# Patient Record
Sex: Male | Born: 1965
Health system: Southern US, Community
[De-identification: ages and names within clinical notes are randomized; demographics above are authoritative.]

## PROBLEM LIST (undated history)

## (undated) DIAGNOSIS — E119 Type 2 diabetes mellitus without complications: Secondary | ICD-10-CM

## (undated) DIAGNOSIS — K7689 Other specified diseases of liver: Secondary | ICD-10-CM

## (undated) DIAGNOSIS — K219 Gastro-esophageal reflux disease without esophagitis: Secondary | ICD-10-CM

## (undated) DIAGNOSIS — T7840XA Allergy, unspecified, initial encounter: Secondary | ICD-10-CM

## (undated) DIAGNOSIS — F419 Anxiety disorder, unspecified: Secondary | ICD-10-CM

## (undated) DIAGNOSIS — M1990 Unspecified osteoarthritis, unspecified site: Secondary | ICD-10-CM

## (undated) DIAGNOSIS — R079 Chest pain, unspecified: Secondary | ICD-10-CM

## (undated) DIAGNOSIS — E669 Obesity, unspecified: Secondary | ICD-10-CM

## (undated) DIAGNOSIS — I10 Essential (primary) hypertension: Secondary | ICD-10-CM

## (undated) DIAGNOSIS — R7989 Other specified abnormal findings of blood chemistry: Secondary | ICD-10-CM

## (undated) HISTORY — PX: COLONOSCOPY: SHX174

## (undated) HISTORY — PX: HERNIA REPAIR: SHX51

## (undated) HISTORY — DX: Unspecified osteoarthritis, unspecified site: M19.90

## (undated) HISTORY — DX: Chest pain, unspecified: R07.9

## (undated) HISTORY — DX: Obesity, unspecified: E66.9

## (undated) HISTORY — DX: Allergy, unspecified, initial encounter: T78.40XA

## (undated) HISTORY — PX: CHOLECYSTECTOMY: SHX55

## (undated) HISTORY — DX: Other specified diseases of liver: K76.89

## (undated) HISTORY — DX: Type 2 diabetes mellitus without complications: E11.9

## (undated) HISTORY — PX: BACK SURGERY: SHX140

## (undated) HISTORY — DX: Other specified abnormal findings of blood chemistry: R79.89

## (undated) HISTORY — DX: Essential (primary) hypertension: I10

---

## 2010-10-17 ENCOUNTER — Inpatient Hospital Stay (HOSPITAL_COMMUNITY)
Admission: EM | Admit: 2010-10-17 | Discharge: 2010-10-18 | Disposition: A | Discharge status: Home / Self Care | Payer: BC Managed Care – PPO | Attending: Cardiovascular Disease | Admitting: Cardiovascular Disease

## 2010-10-18 LAB — BASIC METABOLIC PANEL
Calcium: 9.4 mg/dL (ref 8.4–10.5)
GFR calc Af Amer: >60 mL/min (ref >60)
GFR calc non Af Amer: >60 mL/min (ref >60)
Glucose, Bld: 93 mg/dL (ref 70–99)
Potassium: 4.2 mEq/L (ref 3.5–5.1)
Sodium: 140 mEq/L (ref 135–145)

## 2010-10-18 LAB — PROTIME-INR
INR: 1.02 (ref 0.00–1.49)
Prothrombin Time: 13.6 seconds (ref 11.6–15.2)

## 2010-10-18 LAB — CBC
HCT: 42.1 % (ref 39.0–52.0)
MCHC: 34.9 g/dL (ref 30.0–36.0)
Platelets: 147 10*3/uL — ABNORMAL LOW (ref 150–400)
RDW: 13.6 % (ref 11.5–15.5)
WBC: 6.2 10*3/uL (ref 4.0–10.5)

## 2010-10-24 ENCOUNTER — Telehealth: Payer: Self-pay | Admitting: Internal Medicine

## 2010-11-03 NOTE — Progress Notes (Signed)
Summary: pt not feeling well since he started protonix/2nd call  Phone Note Call from Patient Call back at [phone]   Caller: Patient Reason for Call: Talk to Nurse, Talk to Doctor Summary of Call: pt was given a Rx for protonix and his whole body is sore and hurts and he is very tired is that a side effect of medication. Initial call taken by: Omer Jack,  October 24, 2010 12:13 PM  Follow-up for Phone Call        pt states to please call him after 3pm. Follow-up by: Roe Coombs,  October 24, 2010 2:34 PM  Additional Follow-up for Phone Call Additional follow up Details #1::        spoke w/pt he states that he has been feeling bad, c/o increased fatigue not sure what its from advised cath ok, to f/u w/pcp have thyroid checked he is agreeable Meredith Staggers, RN  October 24, 2010 3:39 PM

## 2010-11-08 ENCOUNTER — Ambulatory Visit: Payer: Self-pay | Admitting: Gastroenterology

## 2010-11-08 DIAGNOSIS — R69 Illness, unspecified: Secondary | ICD-10-CM

## 2010-11-09 NOTE — Procedures (Signed)
  NAME:  GARDINER, ESPANA               ACCOUNT NO.:  [id number]  MEDICAL RECORD NO.:  [id number]          PATIENT TYPE:  INP  LOCATION:  6522                         FACILITY:  [facility]  PHYSICIAN:  Bevelyn Buckles. Tajuana Kniskern, MDDATE OF BIRTH:  10-15-1965  DATE OF PROCEDURE:  10/18/2010 DATE OF DISCHARGE:  10/18/2010                           CARDIAC CATHETERIZATION   PATIENT IDENTIFICATION:  Mr. Pabst is a 45 year old male with a history of obesity and hypertension who was admitted to [facility] with chest pain.  He ruled out for MI with serial cardiac markers.  EKG was nonacute.  He went for a Myoview, which had normal EKG, but question of perfusion defect, and was referred for cardiac catheterization.  PROCEDURES PERFORMED: 1. Selective coronary angiography. 2. Left heart catheterization. 3. Left ventriculogram.  DESCRIPTION OF PROCEDURE:  The risks and indications were explained. Consent was signed and placed on the chart.  After confirmation of a normal Allen's test, the right wrist area was prepped and draped in routine sterile fashion, anesthetized with 1% local lidocaine.  A 5- French arterial sheath was placed using a modified Seldinger technique. Standard catheters including a JR-4, JR-3.5 angled pigtail were used. All catheter exchanges were made over wire.  There were no apparent complications.  Central aortic pressure 113/80 with a mean of 95.  LV pressure 107/6 with an EDP of 11.  There is no aortic stenosis.  Left main was normal.  LAD coursed to the apex, gave off 2 diagonal branches.  It had perhaps mild tapering in the mid-to-distal vessel, but no significant stenoses.  Left circumflex gave off small OM-1, a large OM-2, and a posterolateral and was angiographically normal.  Right coronary artery was a large dominant vessel, gave off large PDA and posterolateral, and was angiographically normal.  Left ventriculogram done in the RAO position showed an EF of  55% to 60% with no regional wall motion abnormalities.  ASSESSMENT: 1. Normal coronary arteries. 2. Normal left ventricular function.  PLAN/DISCUSSION:  Based on his coronary anatomy, I do not suspect his chest pain is cardiac in nature.  I suspect that his stress test was false positive.  We will have him be discharged today as long as his access site is stable.  I would start Protonix 40 mg a day and he can follow up with GI as an outpatient as needed for persistent chest pain.     Bevelyn Buckles. Kalli Greenfield, MD     DRB/MEDQ  D:  10/18/2010  T:  10/19/2010  Job:  [id number]  Electronically Signed by Arvilla Meres MD on 11/09/2010 01:52:31 PM

## 2010-11-09 NOTE — Discharge Summary (Signed)
NAME:  Brad [name], Brad [name]               ACCOUNT NO.:  [id number]  MEDICAL RECORD NO.:  [id number]          PATIENT TYPE:  INP  LOCATION:  6522                         FACILITY:  [facility]  PHYSICIAN:  Bevelyn Buckles. Bensimhon, MDDATE OF BIRTH:  April 01, 1966  DATE OF ADMISSION:  10/17/2010 DATE OF DISCHARGE:  10/18/2010                              DISCHARGE SUMMARY   DISCHARGE DIAGNOSIS:  Noncardiac chest pain (most likely gastrointestinal in etiology). A.  Abnormal stress Myoview at [facility]. B.  Cardiac catheterization, October 18, 2010, revealing normal coronary arteries with normal left ventricular systolic function with left ventricular ejection fraction 55-60%.  Symptoms are most consistent with gastroesophageal reflux disease.  The patient was given prescription for Protonix 40 mg p.o. daily and instructed to follow up with primary care physician or gastroenterology if he prefers.  SECONDARY DIAGNOSIS:  Hypertension. A.  No changes made to preadmission medication regimen with minimal hypertension noted this admission.  ALLERGIES:  NKDA.  PROCEDURES:  Cardiac catheterization, October 18, 2010:  Please see discharge diagnoses section #1, subsection B.  HISTORY OF PRESENT ILLNESS:  Brad [name] is a 45 year old gentleman with no known history of coronary artery disease with risk factor of hypertension who presented to [facility] with complaints of chest discomfort on October 14, 2010.  He first noted this when he was at work sitting at his desk and it lasted all day.  The pain would wax and wane, but became worse for about an hour and then would subside.  It never entirely resolved.  He described there is a tightness in his left chest and was associated with left arm numbness and tingling.  Sometimes, it is hard for him to breathe (pleuritic).  When he came to [facility], he described associated diaphoresis and nausea when his pain was at its worst.  He was treated  with a nitroglycerin patch, which he reports significantly alleviating his symptoms.  The patient is certain that his symptoms are not worse with activity.  He does not associate his symptoms with p.o. intake.  He has had episodes of nausea and vomiting after eating, but this was not preceded by the same symptoms leading to his evaluation at [facility] this time.  The patient eventually underwent stress test at [facility] that was abnormal, and he was subsequently sent to [facility] for diagnostic cardiac catheterization.  HOSPITAL COURSE:  The patient was admitted in the early morning of October 18, 2010, and eventually underwent cardiac catheterization on that same date by cardiologist, Dr. Arvilla Meres.  He had normal coronary arteries with normal LV function, EF 55-60%.  It was determined by Dr. Arvilla Meres that his symptoms were most consistent with gastrointestinal etiology, and therefore he was given a prescription for Protonix 40 mg p.o. daily, and instructed to follow up with a primary care physician for Gastroenterology if he preferred.  At the time of discharge, the patient received his new medication list, prescription for Protonix, postcath instructions, followup instructions, and all questions and concerns were addressed prior to him leaving the hospital.  DISCHARGE LABORATORIES:  WBC is 6.2, HGB 14.7, HCT 42.1, PLT  count is 147.  Protime 13.6, INR 1.02.  Sodium 147, potassium 4.2, chloride 102, bicarb 28, BUN 11, creatinine 1.07, calcium 9.4, glucose 93.  FOLLOWUP PLANS AND APPOINTMENTS:  Please see hospital course.  DISCHARGE MEDICATIONS: 1. Benazepril 20 mg p.o. daily. 2. Norvasc 5 mg p.o. daily. 3. Protonix 40 mg p.o. daily.  DURATION OF DISCHARGE ENCOUNTER:  Including the physician time was 45 minutes.     Jarrett Ables, PAC   ______________________________ Bevelyn Buckles. Bensimhon, MD    MS/MEDQ  D:  10/18/2010  T:  10/19/2010  Job:   [id number]  Electronically Signed by Jarrett Ables PAC on 10/26/2010 03:01:26 PM Electronically Signed by Arvilla Meres MD on 11/09/2010 01:52:28 PM

## 2011-02-23 ENCOUNTER — Encounter: Payer: Self-pay | Admitting: *Deleted

## 2011-02-24 ENCOUNTER — Encounter: Payer: Self-pay | Admitting: Cardiology

## 2011-02-24 ENCOUNTER — Ambulatory Visit (INDEPENDENT_AMBULATORY_CARE_PROVIDER_SITE_OTHER): Payer: BC Managed Care – PPO | Admitting: Cardiology

## 2011-02-24 VITALS — BP 134/88 | HR 63 | Ht 75.0 in | Wt 280.0 lb

## 2011-02-24 DIAGNOSIS — I10 Essential (primary) hypertension: Secondary | ICD-10-CM | POA: Insufficient documentation

## 2011-02-24 DIAGNOSIS — R0789 Other chest pain: Secondary | ICD-10-CM

## 2011-02-24 DIAGNOSIS — K219 Gastro-esophageal reflux disease without esophagitis: Secondary | ICD-10-CM

## 2011-02-24 DIAGNOSIS — R9430 Abnormal result of cardiovascular function study, unspecified: Secondary | ICD-10-CM | POA: Insufficient documentation

## 2011-02-24 DIAGNOSIS — R079 Chest pain, unspecified: Secondary | ICD-10-CM | POA: Insufficient documentation

## 2011-02-24 MED ORDER — NITROGLYCERIN 0.4 MG SL SUBL: 0.4000 mg | SUBLINGUAL_TABLET | SUBLINGUAL | Status: DC | PRN

## 2011-02-24 NOTE — Assessment & Plan Note (Signed)
Given the patient prescription of sublingual nitroglycerin as in the past this has provided relief. I do not think the patient has coronary spasm however esophageal spasm which seemed to improve his symptoms. I told him that he needs to use nitroglycerin on a consistent basis to make an appointment with a gastroenterologist and further diagnostic evaluation may be indicated

## 2011-02-24 NOTE — Progress Notes (Signed)
HPI The patient is a 45 year old male with history of obesity and hypertension was admitted earlier this year to [facility] hospital with chest pain. He ruled out for MI with serial cardiac markers. There were no acute EKG changes. He underwent a Lexi scan which had a normal EKG but there was some question of a perfusion defect. He was then referred for cardiac catheterization. His catheterization was performed in January of 2012 he was found to have normal coronary arteries and normal LV function. It appeared that he had a false-positive stress test. Recommendations were to followup with GI and to start Protonix 40 mg by mouth daily. The patient requested nonappointment because of ongoing symptoms of chest pain. His symptoms of chest pain however very atypical. He has had some problems with reflux which could include Protonix. There is no exertional component to this. I suspect the patient has some esophageal spasm. There has been relieved aspirin sublingual nitroglycerin. His blood pressure diastolic blood pressure mildly elevated. The patient also complains of some swelling in the left lower extremity where he has quite a few varicose veins and also oker dermatitis secondary to pigmentation from his varicose veins. He doesn't really report any night cramps. The patient does have a standing job all day long. He denies any orthopnea PND palpitations or syncope and otherwise from a cardiac standpoint is doing quite well.  No Known Allergies  Current Outpatient Prescriptions on File Prior to Visit  Medication Sig Dispense Refill  . amLODipine-benazepril (LOTREL) 5-20 MG per capsule Take 1 capsule by mouth daily.        . pantoprazole (PROTONIX) 40 MG tablet Take 40 mg by mouth daily.          Past Medical History  Diagnosis Date  . Chest pain, unspecified   . Unspecified essential hypertension   . Obesity, unspecified   . Other chronic nonalcoholic liver disease   . Other abnormal blood chemistry       Past Surgical History  Procedure Date  . Cholecystectomy     Family History  Problem Relation Age of Onset  . Coronary artery disease      FATHER    History   Social History  . Marital Status: Married    Spouse Name: CYNTHIA    Number of Children: N/A  . Years of Education: N/A   Occupational History  . [employer]    Social History Main Topics  . Smoking status: Never Smoker   . Smokeless tobacco: Never Used  . Alcohol Use: Not on file  . Drug Use: Not on file  . Sexually Active: Not on file   Other Topics Concern  . Not on file   Social History Narrative  . No narrative on file   [id number] positives as outlined above. The remainder of the 18  point review of systems is negative   PHYSICAL EXAM BP 134/88  Pulse 63  Ht 6\' 3"  (1.905 m)  Wt 280 lb (127.007 kg)  BMI 35.00 kg/m2  General: Well-developed, well-nourished in no distress Head: Normocephalic and atraumatic Eyes:PERRLA/EOMI intact, conjunctiva and lids normal Ears: No deformity or lesions Mouth:normal dentition, normal posterior pharynx Neck: Supple, no JVD.  No masses, thyromegaly or abnormal cervical nodes Lungs: Normal breath sounds bilaterally without wheezing.  Normal percussion Cardiac: regular rate and rhythm with normal S1 and S2, no S3 or S4.  PMI is normal.  No pathological murmurs Abdomen: Normal bowel sounds, abdomen is soft and nontender without masses, organomegaly or hernias  noted.  No hepatosplenomegaly MSK: Back normal, normal gait muscle strength and tone normal Vascular: Pulse is normal in all 4 extremities Extremities: No peripheral pitting edema Neurologic: Alert and oriented x 3 Skin: Intact without lesions or rashes Lymphatics: No significant adenopathy Psychologic: Normal affect  ECG:  ASSESSMENT AND PLAN

## 2011-02-24 NOTE — Patient Instructions (Signed)
   Low pressure compression stockings  Elevate legs  Nitroglycerin as needed for severe chest pain only  Monitor blood pressure at home.  If diastolic (bottom number) is greater than 85, call the office - may need addition of possible diuretic.   Your physician wants you to follow up in:  1 year.  You will receive a reminder letter in the mail one-two months in advance.  If you don't receive a letter, please call our office to schedule the follow up appointment

## 2011-02-24 NOTE — Assessment & Plan Note (Signed)
No significant coronary artery disease: The patient still concerned given the prior false-positive nuclear study but his cardiac catheterization could be a false-negative study. I told him that this is extremely unlikely and that there is no evidence that he has any significant coronary artery disease.

## 2011-02-24 NOTE — Assessment & Plan Note (Signed)
Mild elevation in diastolic blood pressure. I've asked the patient to check his blood pressure at home. His diastolic blood pressures consistently greater than 85 mmHg advising to call her office and we will also likely add a small dose of a diuretic on chlorthalidone 12.5 mg a day particularly in light of the fact that he is taking to vasodilators.

## 2011-04-06 ENCOUNTER — Other Ambulatory Visit: Payer: Self-pay | Admitting: *Deleted

## 2011-04-06 MED ORDER — PANTOPRAZOLE SODIUM 40 MG PO TBEC: 40.0000 mg | DELAYED_RELEASE_TABLET | Freq: Every day | ORAL | Status: DC

## 2011-11-15 ENCOUNTER — Encounter (HOSPITAL_COMMUNITY): Payer: Self-pay | Admitting: Pharmacy Technician

## 2011-11-21 NOTE — Pre-Procedure Instructions (Signed)
[name]  11/21/2011   Your procedure is scheduled on:  MARCH 13  Report to [facility] at 0630 AM.  Call this number if you have problems the morning of surgery: [phone]   Remember:   Do not eat food:After Midnight.  May have clear liquids: up to 4 Hours before arrival.230AM  Clear liquids include soda, tea, black coffee, apple or grape juice, broth.  Take these medicines the morning of surgery with A SIP OF WATER: AMLODIPINE-BENAZEPRIL,HYDROCODONE   Do not wear jewelry, make-up or nail polish.  Do not wear lotions, powders, or perfumes. You may wear deodorant.  Do not shave 48 hours prior to surgery.  Do not bring valuables to the hospital.  Contacts, dentures or bridgework may not be worn into surgery.  Leave suitcase in the car. After surgery it may be brought to your room.  For patients admitted to the hospital, checkout time is 11:00 AM the day of discharge.   Patients discharged the day of surgery will not be allowed to drive home.  Name and phone number of your driver: FAMILY  Special Instructions: CHG Shower Use Special Wash: 1/2 bottle night before surgery and 1/2 bottle morning of surgery.   Please read over the following fact sheets that you were given: MRSA Information and Surgical Site Infection Prevention

## 2011-11-22 ENCOUNTER — Encounter (HOSPITAL_COMMUNITY)
Admission: RE | Admit: 2011-11-22 | Discharge: 2011-11-22 | Disposition: A | Discharge status: Home / Self Care | Payer: BC Managed Care – PPO | Source: Ambulatory Visit | Attending: Neurosurgery | Admitting: Neurosurgery

## 2011-11-22 ENCOUNTER — Other Ambulatory Visit: Payer: Self-pay

## 2011-11-22 ENCOUNTER — Encounter (HOSPITAL_COMMUNITY)
Admission: RE | Admit: 2011-11-22 | Discharge: 2011-11-22 | Disposition: A | Discharge status: Home / Self Care | Payer: BC Managed Care – PPO | Source: Ambulatory Visit | Attending: Anesthesiology | Admitting: Anesthesiology

## 2011-11-22 ENCOUNTER — Encounter (HOSPITAL_COMMUNITY): Payer: Self-pay

## 2011-11-22 LAB — CBC
Hemoglobin: 16.1 g/dL (ref 13.0–17.0)
MCH: 30.4 pg (ref 26.0–34.0)
Platelets: 137 10*3/uL — ABNORMAL LOW (ref 150–400)
RBC: 5.29 MIL/uL (ref 4.22–5.81)
WBC: 7.3 10*3/uL (ref 4.0–10.5)

## 2011-11-22 LAB — BASIC METABOLIC PANEL
Calcium: 10.3 mg/dL (ref 8.4–10.5)
GFR calc Af Amer: >90 mL/min (ref >90)
GFR calc non Af Amer: 83 mL/min — ABNORMAL LOW (ref >90)
Glucose, Bld: 97 mg/dL (ref 70–99)
Potassium: 5.3 mEq/L — ABNORMAL HIGH (ref 3.5–5.1)
Sodium: 140 mEq/L (ref 135–145)

## 2011-11-22 LAB — SURGICAL PCR SCREEN
MRSA, PCR: NEGATIVE
Staphylococcus aureus: NEGATIVE

## 2011-11-22 NOTE — Pre-Procedure Instructions (Signed)
[name]  11/22/2011   Your procedure is scheduled on:  11/29/11*  Report to [facility] at 630 AM.  Call this number if you have problems the morning of surgery: [phone]   Remember:   Do not eat food:After Midnight.  May have clear liquids: up to 4 Hours before arrival.  Clear liquids include soda, tea, black coffee, apple or grape juice, broth.  Take these medicines the morning of surgery with A SIP OF WATER AMLODIPINE,NORCO, ROBAXIN   Do not wear jewelry, make-up or nail polish.  Do not wear lotions, powders, or perfumes. You may wear deodorant.  Do not shave 48 hours prior to surgery.  Do not bring valuables to the hospital.  Contacts, dentures or bridgework may not be worn into surgery.  Leave suitcase in the car. After surgery it may be brought to your room.  For patients admitted to the hospital, checkout time is 11:00 AM the day of discharge.   Patients discharged the day of surgery will not be allowed to drive home.  Name and phone number of your driver:  Special Instructions: CHG Shower Use Special Wash: 1/2 bottle night before surgery and 1/2 bottle morning of surgery.   Please read over the following fact sheets that you were given: Pain Booklet, Coughing and Deep Breathing, Blood Transfusion Information, MRSA Information and Surgical Site Infection Prevention

## 2011-11-22 NOTE — Progress Notes (Signed)
NOTE IN EPIC FROM DR Berstein [facility]iker [facility] 02/24/11 WHICH INCLUDES STRESS AND CATH RESULTS

## 2011-11-28 MED ORDER — CEFAZOLIN SODIUM-DEXTROSE 2-3 GM-% IV SOLR
2.0000 g | INTRAVENOUS | Status: AC
  Administered 2011-11-29: 2 g via INTRAVENOUS
  Filled 2011-11-28: qty 50

## 2011-11-29 ENCOUNTER — Encounter (HOSPITAL_COMMUNITY): Payer: Self-pay | Admitting: Surgery

## 2011-11-29 ENCOUNTER — Ambulatory Visit (HOSPITAL_COMMUNITY)
Admission: RE | Admit: 2011-11-29 | Discharge: 2011-12-01 | Disposition: A | Discharge status: Home / Self Care | Payer: BC Managed Care – PPO | Source: Ambulatory Visit | Attending: Neurosurgery | Admitting: Neurosurgery

## 2011-11-29 ENCOUNTER — Encounter (HOSPITAL_COMMUNITY): Payer: Self-pay | Admitting: Anesthesiology

## 2011-11-29 ENCOUNTER — Ambulatory Visit (HOSPITAL_COMMUNITY): Payer: BC Managed Care – PPO | Admitting: Anesthesiology

## 2011-11-29 ENCOUNTER — Ambulatory Visit (HOSPITAL_COMMUNITY): Payer: BC Managed Care – PPO

## 2011-11-29 ENCOUNTER — Encounter (HOSPITAL_COMMUNITY)
Admission: RE | Disposition: A | Discharge status: Home / Self Care | Payer: Self-pay | Source: Ambulatory Visit | Attending: Neurosurgery

## 2011-11-29 DIAGNOSIS — M5126 Other intervertebral disc displacement, lumbar region: Secondary | ICD-10-CM | POA: Insufficient documentation

## 2011-11-29 DIAGNOSIS — M5124 Other intervertebral disc displacement, thoracic region: Secondary | ICD-10-CM | POA: Insufficient documentation

## 2011-11-29 DIAGNOSIS — Z01812 Encounter for preprocedural laboratory examination: Secondary | ICD-10-CM | POA: Insufficient documentation

## 2011-11-29 DIAGNOSIS — R339 Retention of urine, unspecified: Secondary | ICD-10-CM | POA: Insufficient documentation

## 2011-11-29 HISTORY — PX: LUMBAR LAMINECTOMY/DECOMPRESSION MICRODISCECTOMY: SHX5026

## 2011-11-29 LAB — TYPE AND SCREEN
ABO/RH(D): O NEG
Antibody Screen: POSITIVE

## 2011-11-29 SURGERY — LUMBAR LAMINECTOMY/DECOMPRESSION MICRODISCECTOMY
Anesthesia: General | Site: Back | Wound class: Clean

## 2011-11-29 MED ORDER — ROCURONIUM BROMIDE 100 MG/10ML IV SOLN
INTRAVENOUS | Status: DC | PRN
  Administered 2011-11-29: 50 mg via INTRAVENOUS
  Administered 2011-11-29 (×4): 10 mg via INTRAVENOUS

## 2011-11-29 MED ORDER — HYDROCODONE-ACETAMINOPHEN 5-325 MG PO TABS: 1.0000 | ORAL_TABLET | ORAL | Status: DC | PRN

## 2011-11-29 MED ORDER — OXYCODONE-ACETAMINOPHEN 5-325 MG PO TABS
1.0000 | ORAL_TABLET | ORAL | Status: DC | PRN
  Administered 2011-11-29 – 2011-12-01 (×8): 2 via ORAL
  Filled 2011-11-29 (×7): qty 2

## 2011-11-29 MED ORDER — ACETAMINOPHEN 650 MG RE SUPP: 650.0000 mg | RECTAL | Status: DC | PRN

## 2011-11-29 MED ORDER — CEFAZOLIN SODIUM 1-5 GM-% IV SOLN
1.0000 g | Freq: Three times a day (TID) | INTRAVENOUS | Status: AC
Start: 2011-11-29 — End: 2011-11-30
  Administered 2011-11-29 – 2011-11-30 (×3): 1 g via INTRAVENOUS
  Filled 2011-11-29 (×2): qty 50

## 2011-11-29 MED ORDER — ONDANSETRON HCL 4 MG/2ML IJ SOLN: 4.0000 mg | Freq: Once | INTRAMUSCULAR | Status: DC | PRN

## 2011-11-29 MED ORDER — PHENOL 1.4 % MT LIQD: 1.0000 | OROMUCOSAL | Status: DC | PRN

## 2011-11-29 MED ORDER — LIDOCAINE-EPINEPHRINE 1 %-1:100000 IJ SOLN
INTRAMUSCULAR | Status: DC | PRN
  Administered 2011-11-29: 10 mL

## 2011-11-29 MED ORDER — METHOCARBAMOL 500 MG PO TABS
500.0000 mg | ORAL_TABLET | Freq: Four times a day (QID) | ORAL | Status: DC | PRN
  Administered 2011-11-29 – 2011-11-30 (×3): 500 mg via ORAL
  Filled 2011-11-29 (×3): qty 1

## 2011-11-29 MED ORDER — HEMOSTATIC AGENTS (NO CHARGE) OPTIME
TOPICAL | Status: DC | PRN
  Administered 2011-11-29: 1 via TOPICAL

## 2011-11-29 MED ORDER — SODIUM CHLORIDE 0.9 % IR SOLN
Status: DC | PRN
  Administered 2011-11-29: 10:00:00

## 2011-11-29 MED ORDER — NAPROXEN 250 MG PO TABS
250.0000 mg | ORAL_TABLET | Freq: Two times a day (BID) | ORAL | Status: DC
  Administered 2011-11-29 – 2011-12-01 (×3): 250 mg via ORAL
  Filled 2011-11-29 (×6): qty 1

## 2011-11-29 MED ORDER — AMLODIPINE BESY-BENAZEPRIL HCL 5-20 MG PO CAPS: 1.0000 | ORAL_CAPSULE | Freq: Every day | ORAL | Status: DC

## 2011-11-29 MED ORDER — HYDROMORPHONE HCL PF 1 MG/ML IJ SOLN
INTRAMUSCULAR | Status: AC
  Administered 2011-11-29: 0.5 mg via INTRAVENOUS
  Filled 2011-11-29: qty 1

## 2011-11-29 MED ORDER — AMLODIPINE BESYLATE 5 MG PO TABS
5.0000 mg | ORAL_TABLET | Freq: Every day | ORAL | Status: DC
  Administered 2011-11-30: 5 mg via ORAL
  Filled 2011-11-29 (×3): qty 1

## 2011-11-29 MED ORDER — ACETAMINOPHEN 325 MG PO TABS: 650.0000 mg | ORAL_TABLET | ORAL | Status: DC | PRN

## 2011-11-29 MED ORDER — THROMBIN 5000 UNITS EX SOLR
CUTANEOUS | Status: DC | PRN
  Administered 2011-11-29: 5000 [IU] via TOPICAL

## 2011-11-29 MED ORDER — 0.9 % SODIUM CHLORIDE (POUR BTL) OPTIME
TOPICAL | Status: DC | PRN
  Administered 2011-11-29: 1000 mL

## 2011-11-29 MED ORDER — LACTATED RINGERS IV SOLN
INTRAVENOUS | Status: DC | PRN
  Administered 2011-11-29 (×3): via INTRAVENOUS

## 2011-11-29 MED ORDER — PHENYLEPHRINE HCL 10 MG/ML IJ SOLN
10.0000 mg | INTRAVENOUS | Status: DC | PRN
  Administered 2011-11-29: 20 ug/min via INTRAVENOUS

## 2011-11-29 MED ORDER — CYCLOBENZAPRINE HCL 10 MG PO TABS
ORAL_TABLET | ORAL | Status: AC
  Filled 2011-11-29: qty 1

## 2011-11-29 MED ORDER — MENTHOL 3 MG MT LOZG: 1.0000 | LOZENGE | OROMUCOSAL | Status: DC | PRN

## 2011-11-29 MED ORDER — BENAZEPRIL HCL 20 MG PO TABS
20.0000 mg | ORAL_TABLET | Freq: Every day | ORAL | Status: DC
  Administered 2011-11-30: 20 mg via ORAL
  Filled 2011-11-29 (×3): qty 1

## 2011-11-29 MED ORDER — SODIUM CHLORIDE 0.9 % IV SOLN
INTRAVENOUS | Status: AC
  Filled 2011-11-29: qty 500

## 2011-11-29 MED ORDER — BUPIVACAINE HCL (PF) 0.25 % IJ SOLN
INTRAMUSCULAR | Status: DC | PRN
  Administered 2011-11-29: 27 mL

## 2011-11-29 MED ORDER — NAPROXEN SODIUM 220 MG PO TABS
220.0000 mg | ORAL_TABLET | Freq: Two times a day (BID) | ORAL | Status: DC
Start: 2011-11-29 — End: 2011-11-29

## 2011-11-29 MED ORDER — MIDAZOLAM HCL 5 MG/5ML IJ SOLN
INTRAMUSCULAR | Status: DC | PRN
  Administered 2011-11-29 (×2): 1 mg via INTRAVENOUS

## 2011-11-29 MED ORDER — OXYCODONE-ACETAMINOPHEN 5-325 MG PO TABS
ORAL_TABLET | ORAL | Status: AC
  Filled 2011-11-29: qty 2

## 2011-11-29 MED ORDER — EPHEDRINE SULFATE 50 MG/ML IJ SOLN
INTRAMUSCULAR | Status: DC | PRN
  Administered 2011-11-29: 5 mg via INTRAVENOUS

## 2011-11-29 MED ORDER — FENTANYL CITRATE 0.05 MG/ML IJ SOLN
INTRAMUSCULAR | Status: DC | PRN
  Administered 2011-11-29 (×2): 25 ug via INTRAVENOUS
  Administered 2011-11-29: 50 ug via INTRAVENOUS
  Administered 2011-11-29: 150 ug via INTRAVENOUS
  Administered 2011-11-29: 50 ug via INTRAVENOUS

## 2011-11-29 MED ORDER — NITROGLYCERIN 0.4 MG SL SUBL: 0.4000 mg | SUBLINGUAL_TABLET | SUBLINGUAL | Status: DC | PRN

## 2011-11-29 MED ORDER — PANTOPRAZOLE SODIUM 40 MG PO TBEC
40.0000 mg | DELAYED_RELEASE_TABLET | Freq: Every day | ORAL | Status: DC
  Administered 2011-11-29 – 2011-11-30 (×2): 40 mg via ORAL
  Filled 2011-11-29 (×2): qty 1

## 2011-11-29 MED ORDER — PANTOPRAZOLE SODIUM 40 MG IV SOLR
40.0000 mg | Freq: Every day | INTRAVENOUS | Status: DC
  Filled 2011-11-29: qty 40

## 2011-11-29 MED ORDER — ONDANSETRON HCL 4 MG/2ML IJ SOLN
INTRAMUSCULAR | Status: DC | PRN
  Administered 2011-11-29: 4 mg via INTRAVENOUS

## 2011-11-29 MED ORDER — HYDROMORPHONE HCL PF 1 MG/ML IJ SOLN
0.2500 mg | INTRAMUSCULAR | Status: DC | PRN
  Administered 2011-11-29 (×2): 0.5 mg via INTRAVENOUS

## 2011-11-29 MED ORDER — BACITRACIN 50000 UNITS IM SOLR
INTRAMUSCULAR | Status: AC
  Filled 2011-11-29: qty 1

## 2011-11-29 MED ORDER — LIDOCAINE HCL (CARDIAC) 20 MG/ML IV SOLN
INTRAVENOUS | Status: DC | PRN
  Administered 2011-11-29: 50 mg via INTRAVENOUS

## 2011-11-29 MED ORDER — ONDANSETRON HCL 4 MG/2ML IJ SOLN: 4.0000 mg | INTRAMUSCULAR | Status: DC | PRN

## 2011-11-29 MED ORDER — CYCLOBENZAPRINE HCL 10 MG PO TABS
10.0000 mg | ORAL_TABLET | Freq: Three times a day (TID) | ORAL | Status: DC | PRN
  Administered 2011-11-29 – 2011-12-01 (×5): 10 mg via ORAL
  Filled 2011-11-29 (×4): qty 1

## 2011-11-29 MED ORDER — PROPOFOL 10 MG/ML IV EMUL
INTRAVENOUS | Status: DC | PRN
  Administered 2011-11-29: 200 mg via INTRAVENOUS
  Administered 2011-11-29: 30 mg via INTRAVENOUS

## 2011-11-29 MED ORDER — PHENYLEPHRINE HCL 10 MG/ML IJ SOLN
INTRAMUSCULAR | Status: DC | PRN
  Administered 2011-11-29 (×4): 40 ug via INTRAVENOUS

## 2011-11-29 MED ORDER — SODIUM CHLORIDE 0.9 % IJ SOLN
3.0000 mL | Freq: Two times a day (BID) | INTRAMUSCULAR | Status: DC
  Administered 2011-11-29 – 2011-11-30 (×3): 3 mL via INTRAVENOUS

## 2011-11-29 SURGICAL SUPPLY — 62 items
ADH SKN CLS APL DERMABOND .7 (GAUZE/BANDAGES/DRESSINGS) ×2
APL SKNCLS STERI-STRIP NONHPOA (GAUZE/BANDAGES/DRESSINGS) ×2
BAG DECANTER FOR FLEXI CONT (MISCELLANEOUS) ×2 IMPLANT
BENZOIN TINCTURE PRP APPL 2/3 (GAUZE/BANDAGES/DRESSINGS) ×3 IMPLANT
BLADE SURG 11 STRL SS (BLADE) ×2 IMPLANT
BLADE SURG ROTATE 9660 (MISCELLANEOUS) IMPLANT
BRUSH SCRUB EZ PLAIN DRY (MISCELLANEOUS) ×2 IMPLANT
BUR MATCHSTICK NEURO 3.0 LAGG (BURR) ×2 IMPLANT
BUR PRECISION FLUTE 6.0 (BURR) ×2 IMPLANT
CANISTER SUCTION 2500CC (MISCELLANEOUS) ×2 IMPLANT
CLOTH BEACON ORANGE TIMEOUT ST (SAFETY) ×2 IMPLANT
CONT SPEC 4OZ CLIKSEAL STRL BL (MISCELLANEOUS) ×2 IMPLANT
DECANTER SPIKE VIAL GLASS SM (MISCELLANEOUS) ×2 IMPLANT
DERMABOND ADVANCED (GAUZE/BANDAGES/DRESSINGS) ×2
DERMABOND ADVANCED .7 DNX12 (GAUZE/BANDAGES/DRESSINGS) ×1 IMPLANT
DRAPE LAPAROTOMY 100X72X124 (DRAPES) ×2 IMPLANT
DRAPE MICROSCOPE LEICA (MISCELLANEOUS) ×1 IMPLANT
DRAPE POUCH INSTRU U-SHP 10X18 (DRAPES) ×2 IMPLANT
DRAPE PROXIMA HALF (DRAPES) IMPLANT
DRAPE SURG 17X23 STRL (DRAPES) ×2 IMPLANT
DRSG OPSITE 4X5.5 SM (GAUZE/BANDAGES/DRESSINGS) ×2 IMPLANT
ELECT BLADE 4.0 EZ CLEAN MEGAD (MISCELLANEOUS) ×2
ELECT REM PT RETURN 9FT ADLT (ELECTROSURGICAL) ×2
ELECTRODE BLDE 4.0 EZ CLN MEGD (MISCELLANEOUS) IMPLANT
ELECTRODE REM PT RTRN 9FT ADLT (ELECTROSURGICAL) ×1 IMPLANT
EVACUATOR 1/8 PVC DRAIN (DRAIN) ×1 IMPLANT
GAUZE SPONGE 4X4 16PLY XRAY LF (GAUZE/BANDAGES/DRESSINGS) ×1 IMPLANT
GLOVE BIO SURGEON STRL SZ8 (GLOVE) ×2 IMPLANT
GLOVE BIOGEL PI IND STRL 8 (GLOVE) IMPLANT
GLOVE BIOGEL PI IND STRL 8.5 (GLOVE) IMPLANT
GLOVE BIOGEL PI INDICATOR 8 (GLOVE) ×1
GLOVE BIOGEL PI INDICATOR 8.5 (GLOVE) ×2
GLOVE ECLIPSE 7.5 STRL STRAW (GLOVE) ×1 IMPLANT
GLOVE EXAM NITRILE LRG STRL (GLOVE) IMPLANT
GLOVE EXAM NITRILE MD LF STRL (GLOVE) ×1 IMPLANT
GLOVE EXAM NITRILE XL STR (GLOVE) IMPLANT
GLOVE EXAM NITRILE XS STR PU (GLOVE) IMPLANT
GLOVE INDICATOR 8.5 STRL (GLOVE) ×2 IMPLANT
GLOVE SURG SS PI 8.0 STRL IVOR (GLOVE) ×4 IMPLANT
GOWN BRE IMP SLV AUR LG STRL (GOWN DISPOSABLE) ×1 IMPLANT
GOWN BRE IMP SLV AUR XL STRL (GOWN DISPOSABLE) ×4 IMPLANT
GOWN STRL REIN 2XL LVL4 (GOWN DISPOSABLE) ×2 IMPLANT
KIT BASIN OR (CUSTOM PROCEDURE TRAY) ×2 IMPLANT
KIT ROOM TURNOVER OR (KITS) ×2 IMPLANT
NDL SPNL 22GX3.5 QUINCKE BK (NEEDLE) ×1 IMPLANT
NEEDLE HYPO 22GX1.5 SAFETY (NEEDLE) ×2 IMPLANT
NEEDLE SPNL 22GX3.5 QUINCKE BK (NEEDLE) ×4 IMPLANT
NS IRRIG 1000ML POUR BTL (IV SOLUTION) ×2 IMPLANT
PACK LAMINECTOMY NEURO (CUSTOM PROCEDURE TRAY) ×2 IMPLANT
RUBBERBAND STERILE (MISCELLANEOUS) ×4 IMPLANT
SPONGE GAUZE 4X4 12PLY (GAUZE/BANDAGES/DRESSINGS) ×1 IMPLANT
SPONGE SURGIFOAM ABS GEL SZ50 (HEMOSTASIS) ×2 IMPLANT
STRIP CLOSURE SKIN 1/2X4 (GAUZE/BANDAGES/DRESSINGS) ×3 IMPLANT
SUT VIC AB 0 CT1 18XCR BRD8 (SUTURE) ×1 IMPLANT
SUT VIC AB 0 CT1 8-18 (SUTURE) ×4
SUT VIC AB 2-0 CT1 18 (SUTURE) ×3 IMPLANT
SUT VICRYL 4-0 PS2 18IN ABS (SUTURE) ×3 IMPLANT
SYR 20ML ECCENTRIC (SYRINGE) ×2 IMPLANT
TOWEL OR 17X24 6PK STRL BLUE (TOWEL DISPOSABLE) ×2 IMPLANT
TOWEL OR 17X26 10 PK STRL BLUE (TOWEL DISPOSABLE) ×2 IMPLANT
TRAY FOLEY CATH 14FRSI W/METER (CATHETERS) ×1 IMPLANT
WATER STERILE IRR 1000ML POUR (IV SOLUTION) ×2 IMPLANT

## 2011-11-29 NOTE — H&P (Signed)
Brad [name] is an 46 y.o. male.   Chief Complaint: Back and left leg pain HPI: Patient is a very pleasant 46 year old gentleman with progressive worsening and left leg pain radiates down the back of his hands his back behind his knee occasionally down to his calf. He denies any right leg pain as a bowel bladder complaints refractory to all forms of conservative treatment symptoms or his physical therapy and steroids. Patient reports worsened with all forms of activity . Workup has revealed a disc herniation at T12-L1 and L4-5 causing spinal cord compression at T12-L1 and lateral recess stenosis at L4-5. Past Medical History  Diagnosis Date  . Chest pain, unspecified   . Unspecified essential hypertension   . Obesity, unspecified   . Other chronic nonalcoholic liver disease   . Other abnormal blood chemistry     Past Surgical History  Procedure Date  . Hernia repair     CHILD  . Cholecystectomy     Family History  Problem Relation Age of Onset  . Coronary artery disease      FATHER   Social History:  reports that he has never smoked. He has never used smokeless tobacco. He reports that he does not drink alcohol or use illicit drugs.  Allergies: No Known Allergies  Medications Prior to Admission  Medication Dose Route Frequency Provider Last Rate Last Dose  . bacitracin 16109 UNITS injection           . ceFAZolin (ANCEF) IVPB 2 g/50 mL premix  2 g Intravenous 60 min Pre-Op Mariam Dollar, MD      . HYDROmorphone (DILAUDID) injection 0.25-0.5 mg  0.25-0.5 mg Intravenous Q5 min PRN Kipp Brood, MD      . ondansetron [facility]) injection 4 mg  4 mg Intravenous Once PRN Kipp Brood, MD      . sodium chloride 0.9 % infusion            Medications Prior to Admission  Medication Sig Dispense Refill  . amLODipine-benazepril (LOTREL) 5-20 MG per capsule Take 1 capsule by mouth daily.        Marland Kitchen HYDROcodone-acetaminophen (NORCO) 5-325 MG per tablet Take 1 tablet by mouth as needed. For  pain.      . methocarbamol (ROBAXIN) 500 MG tablet Take 1 tablet by mouth Once daily as needed. For muscle spasms.      . nitroGLYCERIN (NITROSTAT) 0.4 MG SL tablet Place 0.4 mg under the tongue every 5 (five) minutes as needed. For chest pain.        Results for orders placed during the hospital encounter of 11/29/11 (from the past 48 hour(s))  TYPE AND SCREEN     Status: Normal (Preliminary result)   Collection Time   11/29/11  6:55 AM      Component Value Range Comment   ABO/RH(D) O NEG      Antibody Screen PENDING      Sample Expiration 12/02/2011      No results found.  Review of Systems  Constitutional: Negative.   HENT: Negative.   Eyes: Negative.   Respiratory: Negative.   Cardiovascular: Negative.   Gastrointestinal: Negative.   Genitourinary: Negative.   Musculoskeletal: Positive for myalgias and back pain.  Skin: Negative.   Neurological: Positive for tingling.    Blood pressure 125/79, pulse 80, temperature 97.3 F (36.3 C), temperature source Oral, resp. rate 18, SpO2 95.00%. Physical Exam  Constitutional: He is oriented to person, place, and time. He appears well-developed and well-nourished.  Eyes: Pupils are equal, round, and reactive to light.  Neck: Neck supple.  Respiratory: Breath sounds normal.  GI: Soft. Bowel sounds are normal.  Neurological: He is alert and oriented to person, place, and time. He has normal strength.  Reflex Scores:      Patellar reflexes are 1+ on the right side and 1+ on the left side.      Achilles reflexes are 1+ on the right side and 1+ on the left side.      Strength is 5 out of 5 his iliopsoas, quads, hinges, gastrocs, EHL.     Assessment/Plan 46 year old gentleman who presents with back and left leg pain workup has revealed stenosis at T12-L1 L4-5 she failed all forms of conservative treatment is recommended laminectomy and discectomy at these levels undersurface of the surgery as well as the perioperative course  alternatives surgery expectations of outcome he understands and agrees to proceed forward.  Sharvil Hoey P 11/29/2011, 8:36 AM

## 2011-11-29 NOTE — Op Note (Signed)
Preoperative diagnosis: T12-L1 herniated nucleus pulposus, L4-5 herniated nucleus pulposus and lateral recess stenosis with left-sided L1 and L5 radiculopathies  Postoperative diagnosis: Same  Procedure: Transpedicular discectomy at T12-L1 left with microdissection of the left L1 nerve root microscopic discectomy, and through a separate skin incision a lumbar laminectomy and microdiscectomy at L4-5 on the left with microscopic dissection left L5 nerve root microscopic discectomy  Surgeon: Jillyn Hidden Recie Cirrincione  Asst.: Shirlean Kelly  Anesthesia: Gen.  EBL: 100.  History of present illness: Patient is a very pleasant 46 year old gentleman who presents with back and left leg pain rating into his left groin as well as down the back of his left leg is in his calf and top of his foot. Patient failed all forms of conservative treatment with epidural steroid injections anti-inflammatories and exercise therapy imaging revealed a disc herniation at T12-L1 the left causing spinal cord and cord conus compression as well as lateral recess stenosis without disc herniation L4-5 on the left causing displacement of the proximal left L5 nerve root. This mixture treatment progression of clinical syndrome and MRI findings patient was recommended lumbar limiting and discectomy at L4-5 on the left as well as a transpedicular discectomy at T12-L1 left wrist and benefits of the operation as well as perioperative course and expectations of outcome and alternatives of surgery were also the patient he understood and agreed to proceed forward.  Operative procedure: Patient brought in the or was induced under general anesthesia and positioned prone the Wilson frame his back was prepped and draped in routine sterile fashion. Preoperative x-ray with 2 spinal needles localize the T12-L1 and the L4-5 disc space is. After infiltration 10 cc lidocaine with epi both incisions were opened up separately interoperative X. identify the appropriate  levels in each disc space level to tension was taken first to L4-5 and a decompressive of L4 medial facet complexes suppressant of his drilled down high-speed drill laminotomy was begun with a 2 and a Kerrison punch was identified and removed in piecemeal fashion exposing the thecal sac. L5 pedicle was identified and undergoing of the medial gutter allowed decompression of the roof of the proximal L5 nerve root. Arching of the disc space epidural veins cryocoagulation this herniation was immediately visualized this was then packed with Gelfoam and attention was taken at T12-L1 laminotomy. Again using a high-speed drill the interest in a T12 medial facet complexes suppressant L1 was drilled down using a 2 and inner Kerrison punch laminotomy was begun the L1 pedicle was identified and margins superiorly from this the lateral margins of the disc space is identified. Epidural veins were coagulated. He operative microscope was draped and brought into the field and a much illumination the superomedial aspect of the L1 pedicle was drilled down to gain further access to the lateral margins of this disc space was incised and the disc tear was removed with micropituitary's the herniation was visualized the superior aspect of disc space on the inferior endplate of T12 was teased off of the undersurface of the thecal sac using a 30 Epstein curette and the nerve hook. Tender this was also scraped away there is a fairly sizable herniation that was expressed from the lateral margins the canal underneath the ventral spinal cord to at the discectomy was completed there is no further stenosis it usually excepting I nerve hook underneath the dura and out the L1 foramen. As is impacted Gelfoam to stick and L4-5 the operating microscope was repositioned looking at L4-5 and 30 was used  to reflect the L5 nerve root medially epidural veins are quite limited disc space was incised several large central disc removed and the spaces cleanout  decompressed and the L5 nerve root. And again the discectomy there is no further stenosis on thecal sac or the L5 neuroforamen. This easily accepted nerve hook and angled hockey-stick. Both incisions were then copiously irrigated meticulous in a stasis was maintained Gelfoam was laid over the dura scissor was closed sequentially with interrupted Vicryl and a running 4 subcutaneous and skin the drain was placed at L4-5. At the end of case all needle counts and sponge counts were correct per the nurses.

## 2011-11-29 NOTE — Progress Notes (Signed)
Hemovac with 70cc output noted

## 2011-11-29 NOTE — Transfer of Care (Signed)
Immediate Anesthesia Transfer of Care Note  Patient: Brad [name]  Procedure(s) Performed: Procedure(s) (LRB): LUMBAR LAMINECTOMY/DECOMPRESSION MICRODISCECTOMY (N/A)  Patient Location: PACU  Anesthesia Type: General  Level of Consciousness: awake, alert  and oriented  Airway & Oxygen Therapy: Patient Spontanous Breathing and Patient connected to nasal cannula oxygen  Post-op Assessment: Report given to PACU RN, Post -op Vital signs reviewed and stable and Patient moving all extremities  Post vital signs: Reviewed and stable  Complications: No apparent anesthesia complications

## 2011-11-29 NOTE — Anesthesia Preprocedure Evaluation (Addendum)
Anesthesia Evaluation  Patient identified by MRN, date of birth, ID band Patient awake    Reviewed: Allergy & Precautions, H&P , NPO status , Patient's Chart, lab work & pertinent test results  Airway Mallampati: II TM Distance: >3 FB Neck ROM: Full    Dental  (+) Teeth Intact   Pulmonary  breath sounds clear to auscultation        Cardiovascular hypertension, Pt. on medications Rhythm:Regular Rate:Normal     Neuro/Psych    GI/Hepatic GERD-  ,  Endo/Other    Renal/GU      Musculoskeletal   Abdominal   Peds  Hematology   Anesthesia Other Findings   Reproductive/Obstetrics                        Anesthesia Physical Anesthesia Plan  ASA: III  Anesthesia Plan: General   Post-op Pain Management:    Induction: Intravenous  Airway Management Planned: Oral ETT  Additional Equipment:   Intra-op Plan:   Post-operative Plan: Extubation in OR  Informed Consent: I have reviewed the patients History and Physical, chart, labs and discussed the procedure including the risks, benefits and alternatives for the proposed anesthesia with the patient or authorized representative who has indicated his/her understanding and acceptance.   Dental advisory given  Plan Discussed with: Anesthesiologist and Surgeon  Anesthesia Plan Comments: (Htn Obesity Normal coronaries and LV function by cath 09/2010 HNP T10-L1)       Anesthesia Quick Evaluation

## 2011-11-29 NOTE — Anesthesia Postprocedure Evaluation (Signed)
  Anesthesia Post-op Note  Patient: Brad [name]  Procedure(s) Performed: Procedure(s) (LRB): LUMBAR LAMINECTOMY/DECOMPRESSION MICRODISCECTOMY (N/A)  Patient Location: PACU  Anesthesia Type: General  Level of Consciousness: awake, alert  and oriented  Airway and Oxygen Therapy: Patient Spontanous Breathing  Post-op Pain: mild  Post-op Assessment: Post-op Vital signs reviewed and Patient's Cardiovascular Status Stable  Post-op Vital Signs: stable  Complications: No apparent anesthesia complications

## 2011-11-29 NOTE — Anesthesia Procedure Notes (Signed)
Procedure Name: Intubation Date/Time: 11/29/2011 8:52 AM Performed by: Julianne Rice K Pre-anesthesia Checklist: Patient identified, Timeout performed, Emergency Drugs available, Suction available and Patient being monitored Patient Re-evaluated:Patient Re-evaluated prior to inductionOxygen Delivery Method: Circle system utilized Preoxygenation: Pre-oxygenation with 100% oxygen Intubation Type: IV induction Ventilation: Mask ventilation without difficulty Laryngoscope Size: Mac and 3 Grade View: Grade II Tube type: Oral Tube size: 8.5 mm Number of attempts: 1 Airway Equipment and Method: Stylet Placement Confirmation: ETT inserted through vocal cords under direct vision,  positive ETCO2 and breath sounds checked- equal and bilateral Secured at: 24 cm Tube secured with: Tape Dental Injury: Teeth and Oropharynx as per pre-operative assessment

## 2011-11-29 NOTE — Preoperative (Signed)
Beta Blockers   Reason not to administer Beta Blockers:Not Applicable 

## 2011-11-29 NOTE — Progress Notes (Signed)
Bilateral Thigh High TEDS applied per written orders in physical chart.//L. Janmarie Smoot,RN

## 2011-11-30 NOTE — Progress Notes (Signed)
Subjective: Patient reports But overall is doing fairly well so a lot of incisional pain he is no leg pain he is started were better but still feels it is time to his bladder completely  Objective: Vital signs in last 24 hours: Temp:  [97.5 F (36.4 C)-99.6 F (37.6 C)] 98.7 F (37.1 C) (03/14 0800) Pulse Rate:  [70-101] 87  (03/14 0800) Resp:  [13-20] 20  (03/14 0800) BP: (93-127)/(45-81) 112/56 mmHg (03/14 0800) SpO2:  [90 %-98 %] 92 % (03/14 0800)  Intake/Output from previous day: 03/13 0701 - 03/14 0700 In: 2340 [P.O.:240; I.V.:2100] Out: 795 [Urine:500; Drains:120; Blood:175] Intake/Output this shift:    Brad [name] is 5 out of 5 wound is clean and dry  Lab Results: No results found for this basename: WBC:2,HGB:2,HCT:2,PLT:2 in the last 72 hours BMET No results found for this basename: NA:2,K:2,CL:2,CO2:2,GLUCOSE:2,BUN:2,CREATININE:2,CALCIUM:2 in the last 72 hours  Studies/Results: Dg Lumbar Spine 2-3 Views  11/29/2011  *RADIOLOGY REPORT*  Clinical Data: Discectomy at T12-L1 and L4-5.  LUMBAR SPINE - 2-3 VIEW  Comparison: None  Findings: On the first image there is a surgical probe identified posterior to the L1 and L5 the vertebral.  On the second image there are tissue spreaders and probe posterior to L1 and L5 vertebra.  IMPRESSION:  1.  Portable intraoperative radiographs localize the L1 and L5 vertebra.  Original Report Authenticated By: Rosealee Albee, M.D.    Assessment/Plan: Postoperative day 1 from an 2 laminectomies at T12-L1 and L4-5 doing very well however still having a little bit of urinary tension although it is improving would like to observe him 1 more day.  LOS: 1 day     Brad [name] P 11/30/2011, 9:46 AM

## 2011-12-01 LAB — TYPE AND SCREEN
Antibody Screen: POSITIVE
DAT, IgG: POSITIVE
Unit division: 0
Unit division: 0

## 2011-12-01 MED ORDER — CYCLOBENZAPRINE HCL 10 MG PO TABS: 10.0000 mg | ORAL_TABLET | Freq: Three times a day (TID) | ORAL | Status: AC | PRN

## 2011-12-01 MED ORDER — OXYCODONE-ACETAMINOPHEN 5-325 MG PO TABS: 1.0000 | ORAL_TABLET | ORAL | Status: AC | PRN

## 2011-12-01 NOTE — Progress Notes (Signed)
Subjective: Patient reports Doing well today less incisional pain is having no leg pain angling and voiding spontaneously  Objective: Vital signs in last 24 hours: Temp:  [98.1 F (36.7 C)-99.7 F (37.6 C)] 98.1 F (36.7 C) (03/15 0427) Pulse Rate:  [77-94] 84  (03/15 0427) Resp:  [15-18] 16  (03/15 0427) BP: (96-109)/(51-82) 98/67 mmHg (03/15 0427) SpO2:  [91 %-94 %] 91 % (03/15 0427)  Intake/Output from previous day: 03/14 0701 - 03/15 0700 In: 600 [P.O.:600] Out: 100 [Drains:100] Intake/Output this shift:    Strength is 5 out of 5 wound is clean and dry to  Lab Results: No results found for this basename: WBC:2,HGB:2,HCT:2,PLT:2 in the last 72 hours BMET No results found for this basename: NA:2,K:2,CL:2,CO2:2,GLUCOSE:2,BUN:2,CREATININE:2,CALCIUM:2 in the last 72 hours  Studies/Results: Dg Lumbar Spine 2-3 Views  11/29/2011  *RADIOLOGY REPORT*  Clinical Data: Discectomy at T12-L1 and L4-5.  LUMBAR SPINE - 2-3 VIEW  Comparison: None  Findings: On the first image there is a surgical probe identified posterior to the L1 and L5 the vertebral.  On the second image there are tissue spreaders and probe posterior to L1 and L5 vertebra.  IMPRESSION:  1.  Portable intraoperative radiographs localize the L1 and L5 vertebra.  Original Report Authenticated By: Rosealee Albee, M.D.    Assessment/Plan: Posterior day 2 from laminectomy discectomies at T12-L1 and L4-5 doing well today stable for discharge  LOS: 2 days     Syan Cullimore P 12/01/2011, 8:01 AM

## 2011-12-01 NOTE — Discharge Instructions (Signed)
No lifting no bending or twisting no driving keep her wound clean and dry Wound Care Keep incision covered and dry for one week.  If you shower prior to then, cover incision with plastic wrap.  You may remove outer bandage after one week and shower.  Do not put any creams, lotions, or ointments on incision. Leave steri-strips on neck.  They will fall off by themselves. Activity Walk each and every day, increasing distance each day. No lifting greater than 5 lbs.  Avoid bending, arching, or twisting. No driving for 2 weeks; may ride as a passenger locally. Diet Resume your normal diet.  Return to Work Will be discussed at you follow up appointment. Call Your Doctor If Any of These Occur Redness, drainage, or swelling at the wound.  Temperature greater than 101 degrees. Severe pain not relieved by pain medication. Incision starts to come apart. Follow Up Appt Call today for appointment in 1-2 weeks ([phone]) or for problems.  If you have any hardware placed in your spine, you will need an x-ray before your appointment.

## 2011-12-01 NOTE — Discharge Summary (Signed)
  Physician Discharge Summary  Patient ID: Brad [name] MRN: [id number] DOB/AGE: May 14, 1966 46 y.o.  Admit date: 11/29/2011 Discharge date: 12/01/2011  Admission Diagnoses: Lumbar stenosis or herniated nucleus pulposus T12-L1 and L4-5  Discharge Diagnoses: Same Active Problems:  * No active hospital problems. *    Discharged Condition: good  Hospital Course: Patient was admitted to the hospital underwent laminectomies and discectomies at T12-L1 and L4-5 on left. Patient is a very well postoperatively was angling the posterior they 1 having a little difficulty with voiding he was held in the hospital for additional day to improve his voiding capacity and well controlled at 36 hours postop time of discharge patient is voiding spontaneously ambulating tolerating her diet pain was well-controlled on oral analgesics.  Consults: Significant Diagnostic Studies: Treatments: Lumbar limiting and microdiscectomy is at T12-L1 and L4-5 in the left Discharge Exam: Blood pressure 98/67, pulse 84, temperature 98.1 F (36.7 C), temperature source Oral, resp. rate 16, SpO2 91.00%. Strength 5 out of 5 wound clean and dry.  Disposition: Home   Medication List  As of 12/01/2011  8:03 AM   TAKE these medications         amLODipine-benazepril 5-20 MG per capsule   Commonly known as: LOTREL   Take 1 capsule by mouth daily.      cyclobenzaprine 10 MG tablet   Commonly known as: FLEXERIL   Take 1 tablet (10 mg total) by mouth 3 (three) times daily as needed for muscle spasms.      HYDROcodone-acetaminophen 5-325 MG per tablet   Commonly known as: NORCO   Take 1 tablet by mouth as needed. For pain.      methocarbamol 500 MG tablet   Commonly known as: ROBAXIN   Take 1 tablet by mouth Once daily as needed. For muscle spasms.      naproxen sodium 220 MG tablet   Commonly known as: ANAPROX   Take 220 mg by mouth 2 (two) times daily with a meal.      nitroGLYCERIN 0.4 MG SL tablet   Commonly  known as: NITROSTAT   Place 0.4 mg under the tongue every 5 (five) minutes as needed. For chest pain.      oxyCODONE-acetaminophen 5-325 MG per tablet   Commonly known as: PERCOCET   Take 1-2 tablets by mouth every 4 (four) hours as needed.             Signed: Ihan Pat P 12/01/2011, 8:03 AM

## 2011-12-04 ENCOUNTER — Encounter (HOSPITAL_COMMUNITY): Payer: Self-pay | Admitting: Neurosurgery

## 2011-12-11 ENCOUNTER — Other Ambulatory Visit: Payer: Self-pay | Admitting: Neurosurgery

## 2011-12-11 DIAGNOSIS — M545 Low back pain, unspecified: Secondary | ICD-10-CM

## 2011-12-13 ENCOUNTER — Ambulatory Visit
Admission: RE | Admit: 2011-12-13 | Discharge: 2011-12-13 | Disposition: A | Discharge status: Home / Self Care | Payer: BC Managed Care – PPO | Source: Ambulatory Visit | Attending: Neurosurgery | Admitting: Neurosurgery

## 2011-12-13 DIAGNOSIS — M545 Low back pain, unspecified: Secondary | ICD-10-CM

## 2011-12-13 MED ORDER — GADOBENATE DIMEGLUMINE 529 MG/ML IV SOLN
20.0000 mL | Freq: Once | INTRAVENOUS | Status: AC | PRN
  Administered 2011-12-13: 20 mL via INTRAVENOUS

## 2013-06-03 ENCOUNTER — Encounter: Payer: Self-pay | Admitting: Cardiology

## 2013-06-03 ENCOUNTER — Ambulatory Visit (INDEPENDENT_AMBULATORY_CARE_PROVIDER_SITE_OTHER): Payer: BC Managed Care – PPO | Admitting: Cardiology

## 2013-06-03 VITALS — BP 118/82 | HR 88 | Ht 75.0 in | Wt 274.1 lb

## 2013-06-03 DIAGNOSIS — R0789 Other chest pain: Secondary | ICD-10-CM

## 2013-06-03 NOTE — Progress Notes (Signed)
Clinical Summary Brad [name] is a 47 y.o.male  1. Atypical chest pain - prior evaluation in 2012 w/ chest pain, lexiscan had possible defect, referred for cath. Jan 2012 cath had normal coronaries - prior symptoms better w/ NG, thought to be possible esophageal spasm. Recs were for follow up w/ GI doctor.   - Reports chest pain starting on Wednesday. Felt like soreness right chest, 4/10. Started at rest. No associated symptoms. Lasted all day Wed, Thurs, Friday continously. Nothing made better or worst. Friday morning went to work, noted right arm was tingling and weak feeling. Presented to [facility] ER - Troponins negative, pt w/ HA and CT head was negative. Discharged Sat w/ folllow up EKG stress test which he completed yesterday - Excercised 8 min (pt reports test terminated because at [facility], not because he reached couldn't go longer), no chest pain, no ischemic EKG changes.  - Still has some discomfort today.     Past Medical History  Diagnosis Date  . Chest pain, unspecified   . Unspecified essential hypertension   . Obesity, unspecified   . Other chronic nonalcoholic liver disease   . Other abnormal blood chemistry   Lumbar stenosis s/p laminectomy   No Known Allergies   Current Outpatient Prescriptions  Medication Sig Dispense Refill  . amLODipine-benazepril (LOTREL) 5-20 MG per capsule Take 1 capsule by mouth daily.        Marland Kitchen HYDROcodone-acetaminophen (NORCO) 5-325 MG per tablet Take 1 tablet by mouth as needed. For pain.      . methocarbamol (ROBAXIN) 500 MG tablet Take 1 tablet by mouth Once daily as needed. For muscle spasms.      . naproxen sodium (ANAPROX) 220 MG tablet Take 220 mg by mouth 2 (two) times daily with a meal.      . nitroGLYCERIN (NITROSTAT) 0.4 MG SL tablet Place 0.4 mg under the tongue every 5 (five) minutes as needed. For chest pain.       No current facility-administered medications for this visit.     Past Surgical History  Procedure Laterality  Date  . Hernia repair      CHILD  . Cholecystectomy    . Lumbar laminectomy/decompression microdiscectomy  11/29/2011    Procedure: LUMBAR LAMINECTOMY/DECOMPRESSION MICRODISCECTOMY;  Surgeon: Mariam Dollar, MD;  Location: [facility]UR[facility] OR[facility];  Service: Neurosurgery;  Laterality: N/A;  Lumbar Laminectomy/Discectomy Decompression Thoracic Tweleve-Lumbar One, Lumbar Four-Five     No Known Allergies    Family History  Problem Relation Age of Onset  . Coronary artery disease      FATHER     Social History Brad [name] reports that he has never smoked. He has never used smokeless tobacco. Brad [name] reports that he does not drink alcohol.   Review of Systems 12 point ROS negative other than reported in HPI  Physical Examination p 88 bp 118/82 Gen: resting comfortably, NAD HEENT: no scleral icterus, pupils equal round and reactive, no palptable cervical adenopathy CV Pulm: CTAB Abd: soft, NT, ND NABS, no hepatosplenomegaly Ext: warm, no edema.  Skin: warm, no rash Neuro: A&Ox3, no focal deficits    Diagnostic Studies 06/02/13 Stress EKG: excercised 8 min(test stopped b/c at [facility], no b/c max functional capacity), no ischemic changes, Duke treadmill score 8 low risk    Assessment and Plan  1. Atypical chest pain: atypical story for cardiac chest pain given the qualitative description of the symptoms and the fact ongoing constantly for nearly a week now. No evidence of ACS  during admit, negative EKG stress just yesterday, and patient w/ negative cath in 2012 - no further cardiac workup at this time - symptoms sound muskiloskeletal, recommended a 5 day course of NSAID therapy.  F/u as needed   Antoine Poche, M.D., F.A.C.C.

## 2013-06-03 NOTE — Patient Instructions (Signed)
Your physician recommends that you schedule a follow-up appointment in: As Needed    

## 2013-10-21 ENCOUNTER — Other Ambulatory Visit (HOSPITAL_COMMUNITY): Payer: Self-pay | Admitting: Neurosurgery

## 2013-10-21 DIAGNOSIS — M545 Low back pain, unspecified: Secondary | ICD-10-CM

## 2013-10-21 DIAGNOSIS — M5104 Intervertebral disc disorders with myelopathy, thoracic region: Secondary | ICD-10-CM

## 2013-10-23 ENCOUNTER — Ambulatory Visit (HOSPITAL_COMMUNITY)
Admission: RE | Admit: 2013-10-23 | Discharge: 2013-10-23 | Disposition: A | Discharge status: Home / Self Care | Payer: BC Managed Care – PPO | Source: Ambulatory Visit | Attending: Neurosurgery | Admitting: Neurosurgery

## 2013-10-23 DIAGNOSIS — M5126 Other intervertebral disc displacement, lumbar region: Secondary | ICD-10-CM | POA: Insufficient documentation

## 2013-10-23 DIAGNOSIS — M79609 Pain in unspecified limb: Secondary | ICD-10-CM | POA: Insufficient documentation

## 2013-10-23 DIAGNOSIS — M5104 Intervertebral disc disorders with myelopathy, thoracic region: Secondary | ICD-10-CM

## 2013-10-23 DIAGNOSIS — M545 Low back pain, unspecified: Secondary | ICD-10-CM

## 2013-10-23 LAB — POCT I-STAT CREATININE: Creatinine, Ser: 1.1 mg/dL (ref 0.50–1.35)

## 2013-10-23 MED ORDER — GADOBENATE DIMEGLUMINE 529 MG/ML IV SOLN
20.0000 mL | Freq: Once | INTRAVENOUS | Status: AC | PRN
  Administered 2013-10-23: 20 mL via INTRAVENOUS

## 2013-10-27 ENCOUNTER — Other Ambulatory Visit (HOSPITAL_COMMUNITY): Payer: Self-pay

## 2014-03-11 ENCOUNTER — Other Ambulatory Visit (HOSPITAL_COMMUNITY): Payer: Self-pay | Admitting: Neurosurgery

## 2014-03-11 DIAGNOSIS — M5126 Other intervertebral disc displacement, lumbar region: Secondary | ICD-10-CM

## 2014-03-13 ENCOUNTER — Ambulatory Visit (HOSPITAL_COMMUNITY)
Admission: RE | Admit: 2014-03-13 | Discharge: 2014-03-13 | Disposition: A | Discharge status: Home / Self Care | Payer: BC Managed Care – PPO | Source: Ambulatory Visit | Attending: Neurosurgery | Admitting: Neurosurgery

## 2014-03-13 ENCOUNTER — Other Ambulatory Visit (HOSPITAL_COMMUNITY): Payer: Self-pay | Admitting: Neurosurgery

## 2014-03-13 ENCOUNTER — Encounter (HOSPITAL_COMMUNITY): Payer: Self-pay

## 2014-03-13 DIAGNOSIS — M5126 Other intervertebral disc displacement, lumbar region: Secondary | ICD-10-CM

## 2014-03-13 DIAGNOSIS — M899 Disorder of bone, unspecified: Secondary | ICD-10-CM | POA: Insufficient documentation

## 2014-03-13 DIAGNOSIS — M545 Low back pain, unspecified: Secondary | ICD-10-CM | POA: Insufficient documentation

## 2014-03-13 DIAGNOSIS — M949 Disorder of cartilage, unspecified: Secondary | ICD-10-CM

## 2014-03-13 DIAGNOSIS — Z9889 Other specified postprocedural states: Secondary | ICD-10-CM | POA: Insufficient documentation

## 2014-03-13 LAB — POCT I-STAT CREATININE: CREATININE: 0.9 mg/dL (ref 0.50–1.35)

## 2014-03-13 MED ORDER — GADOBENATE DIMEGLUMINE 529 MG/ML IV SOLN
20.0000 mL | Freq: Once | INTRAVENOUS | Status: AC | PRN
  Administered 2014-03-13: 20 mL via INTRAVENOUS

## 2014-05-13 ENCOUNTER — Other Ambulatory Visit: Payer: Self-pay | Admitting: Neurosurgery

## 2014-05-13 ENCOUNTER — Encounter (HOSPITAL_COMMUNITY): Payer: Self-pay | Admitting: Pharmacy Technician

## 2014-05-14 ENCOUNTER — Encounter (HOSPITAL_COMMUNITY)
Admission: RE | Admit: 2014-05-14 | Discharge: 2014-05-14 | Disposition: A | Discharge status: Home / Self Care | Payer: BC Managed Care – PPO | Source: Ambulatory Visit | Attending: Neurosurgery | Admitting: Neurosurgery

## 2014-05-14 ENCOUNTER — Encounter (HOSPITAL_COMMUNITY)
Admission: RE | Admit: 2014-05-14 | Discharge: 2014-05-14 | Disposition: A | Discharge status: Home / Self Care | Payer: BC Managed Care – PPO | Source: Ambulatory Visit | Attending: Anesthesiology | Admitting: Anesthesiology

## 2014-05-14 ENCOUNTER — Encounter (HOSPITAL_COMMUNITY): Payer: Self-pay

## 2014-05-14 DIAGNOSIS — Z01818 Encounter for other preprocedural examination: Secondary | ICD-10-CM | POA: Diagnosis present

## 2014-05-14 DIAGNOSIS — Q762 Congenital spondylolisthesis: Secondary | ICD-10-CM | POA: Insufficient documentation

## 2014-05-14 DIAGNOSIS — M48061 Spinal stenosis, lumbar region without neurogenic claudication: Secondary | ICD-10-CM | POA: Diagnosis present

## 2014-05-14 DIAGNOSIS — M47817 Spondylosis without myelopathy or radiculopathy, lumbosacral region: Secondary | ICD-10-CM | POA: Diagnosis not present

## 2014-05-14 DIAGNOSIS — M5126 Other intervertebral disc displacement, lumbar region: Secondary | ICD-10-CM | POA: Insufficient documentation

## 2014-05-14 HISTORY — DX: Gastro-esophageal reflux disease without esophagitis: K21.9

## 2014-05-14 LAB — COMPREHENSIVE METABOLIC PANEL
ALK PHOS: 93 U/L (ref 39–117)
ALT: 53 U/L (ref 0–53)
ANION GAP: 13 (ref 5–15)
AST: 43 U/L — ABNORMAL HIGH (ref 0–37)
Albumin: 4.1 g/dL (ref 3.5–5.2)
BILIRUBIN TOTAL: 2.1 mg/dL — AB (ref 0.3–1.2)
BUN: 12 mg/dL (ref 6–23)
CHLORIDE: 102 meq/L (ref 96–112)
CO2: 26 meq/L (ref 19–32)
Calcium: 9.4 mg/dL (ref 8.4–10.5)
Creatinine, Ser: 0.95 mg/dL (ref 0.50–1.35)
Glucose, Bld: 93 mg/dL (ref 70–99)
POTASSIUM: 4 meq/L (ref 3.7–5.3)
Sodium: 141 mEq/L (ref 137–147)
Total Protein: 7.2 g/dL (ref 6.0–8.3)

## 2014-05-14 LAB — CBC
HCT: 44.6 % (ref 39.0–52.0)
Hemoglobin: 16 g/dL (ref 13.0–17.0)
MCH: 31.1 pg (ref 26.0–34.0)
MCHC: 35.9 g/dL (ref 30.0–36.0)
MCV: 86.6 fL (ref 78.0–100.0)
PLATELETS: 143 10*3/uL — AB (ref 150–400)
RBC: 5.15 MIL/uL (ref 4.22–5.81)
RDW: 13.9 % (ref 11.5–15.5)
WBC: 7.4 10*3/uL (ref 4.0–10.5)

## 2014-05-14 LAB — SURGICAL PCR SCREEN
MRSA, PCR: NEGATIVE
Staphylococcus aureus: POSITIVE — AB

## 2014-05-14 NOTE — Pre-Procedure Instructions (Signed)
ARIHANT PENNINGS  05/14/2014   Your procedure is scheduled on: Wednesday, May 20, 2014  Report to [facility] Admitting at 10:00 AM.  Call this number if you have problems the morning of surgery: [phone]   Remember:   Do not eat food or drink liquids after midnight Tuesday, May 19, 2014   Take these medicines the morning of surgery with A SIP OF WATER:  OxyCODONE,  pantoprazole (PROTONIX),  if needed: nitroGLYCERIN (NITROSTAT) for chest pain. Stop taking Aspirin, Multivitamins, and herbal medications. Do not take any NSAIDs ie: Ibuprofen, Advil, Naproxen or any medication containing Aspirin; stop now   Do not wear jewelry, make-up or nail polish.  Do not wear lotions, powders, or perfumes. You may not wear deodorant.  Do not shave 48 hours prior to surgery. Men may shave face and neck.  Do not bring valuables to the hospital.  [facility] is not responsible for any belongings or valuables.               Contacts, dentures or bridgework may not be worn into surgery.  Leave suitcase in the car. After surgery it may be brought to your room.  For patients admitted to the hospital, discharge time is determined by your treatment team.               Patients discharged the day of surgery will not be allowed to drive home.  Name and phone number of your driver:   Special Instructions:  Special Instructions:Special Instructions: [facility] - Preparing for Surgery  Before surgery, you can play an important role.  Because skin is not sterile, your skin needs to be as free of germs as possible.  You can reduce the number of germs on you skin by washing with CHG (chlorahexidine gluconate) soap before surgery.  CHG is an antiseptic cleaner which kills germs and bonds with the skin to continue killing germs even after washing.  Please DO NOT use if you have an allergy to CHG or antibacterial soaps.  If your skin becomes reddened/irritated stop using the CHG and inform your  nurse when you arrive at Short Stay.  Do not shave (including legs and underarms) for at least 48 hours prior to the first CHG shower.  You may shave your face.  Please follow these instructions carefully:   1.  Shower with CHG Soap the night before surgery and the morning of Surgery.  2.  If you choose to wash your hair, wash your hair first as usual with your normal shampoo.  3.  After you shampoo, rinse your hair and body thoroughly to remove the Shampoo.  4.  Use CHG as you would any other liquid soap.  You can apply chg directly  to the skin and wash gently with scrungie or a clean washcloth.  5.  Apply the CHG Soap to your body ONLY FROM THE NECK DOWN.  Do not use on open wounds or open sores.  Avoid contact with your eyes, ears, mouth and genitals (private parts).  Wash genitals (private parts) with your normal soap.  6.  Wash thoroughly, paying special attention to the area where your surgery will be performed.  7.  Thoroughly rinse your body with warm water from the neck down.  8.  DO NOT shower/wash with your normal soap after using and rinsing off the CHG Soap.  9.  Pat yourself dry with a clean towel.  10.  Wear clean pajamas.            11.  Place clean sheets on your bed the night of your first shower and do not sleep with pets.  Day of Surgery  Do not apply any lotions/deodorants the morning of surgery.  Please wear clean clothes to the hospital/surgery center.   Please read over the following fact sheets that you were given: Pain Booklet, Coughing and Deep Breathing, Blood Transfusion Information, MRSA Information and Surgical Site Infection Prevention

## 2014-05-14 NOTE — Progress Notes (Signed)
Pt denies SOB, chest pain, and being under the care of a cardiologist. Pt denies having a chest x ray and EKG within the last year.

## 2014-05-19 ENCOUNTER — Other Ambulatory Visit: Payer: Self-pay | Admitting: Neurosurgery

## 2014-05-20 ENCOUNTER — Inpatient Hospital Stay (HOSPITAL_COMMUNITY)
Admission: RE | Admit: 2014-05-20 | Discharge: 2014-05-23 | DRG: 460 | Disposition: A | Discharge status: Home / Self Care | Payer: BC Managed Care – PPO | Source: Ambulatory Visit | Attending: Neurosurgery | Admitting: Neurosurgery

## 2014-05-20 ENCOUNTER — Encounter (HOSPITAL_COMMUNITY): Payer: BC Managed Care – PPO | Admitting: Anesthesiology

## 2014-05-20 ENCOUNTER — Encounter (HOSPITAL_COMMUNITY): Payer: Self-pay | Admitting: Anesthesiology

## 2014-05-20 ENCOUNTER — Inpatient Hospital Stay (HOSPITAL_COMMUNITY): Payer: BC Managed Care – PPO | Admitting: Anesthesiology

## 2014-05-20 ENCOUNTER — Inpatient Hospital Stay (HOSPITAL_COMMUNITY): Payer: BC Managed Care – PPO

## 2014-05-20 ENCOUNTER — Encounter (HOSPITAL_COMMUNITY)
Admission: RE | Disposition: A | Discharge status: Home / Self Care | Payer: BC Managed Care – PPO | Source: Ambulatory Visit | Attending: Neurosurgery

## 2014-05-20 DIAGNOSIS — K219 Gastro-esophageal reflux disease without esophagitis: Secondary | ICD-10-CM | POA: Diagnosis present

## 2014-05-20 DIAGNOSIS — K769 Liver disease, unspecified: Secondary | ICD-10-CM | POA: Diagnosis present

## 2014-05-20 DIAGNOSIS — Q762 Congenital spondylolisthesis: Principal | ICD-10-CM

## 2014-05-20 DIAGNOSIS — M79609 Pain in unspecified limb: Secondary | ICD-10-CM | POA: Diagnosis present

## 2014-05-20 DIAGNOSIS — Z79899 Other long term (current) drug therapy: Secondary | ICD-10-CM | POA: Diagnosis not present

## 2014-05-20 DIAGNOSIS — M4317 Spondylolisthesis, lumbosacral region: Secondary | ICD-10-CM

## 2014-05-20 DIAGNOSIS — I10 Essential (primary) hypertension: Secondary | ICD-10-CM | POA: Diagnosis present

## 2014-05-20 DIAGNOSIS — M5126 Other intervertebral disc displacement, lumbar region: Secondary | ICD-10-CM | POA: Diagnosis present

## 2014-05-20 LAB — TYPE AND SCREEN
ABO/RH(D): O NEG
ANTIBODY SCREEN: POSITIVE

## 2014-05-20 SURGERY — POSTERIOR LUMBAR FUSION 2 LEVEL
Anesthesia: General | Site: Back

## 2014-05-20 MED ORDER — ROCURONIUM BROMIDE 100 MG/10ML IV SOLN
INTRAVENOUS | Status: DC | PRN
  Administered 2014-05-20: 10 mg via INTRAVENOUS
  Administered 2014-05-20: 5 mg via INTRAVENOUS
  Administered 2014-05-20: 10 mg via INTRAVENOUS
  Administered 2014-05-20: 50 mg via INTRAVENOUS

## 2014-05-20 MED ORDER — OXYCODONE-ACETAMINOPHEN 5-325 MG PO TABS
2.0000 | ORAL_TABLET | ORAL | Status: DC | PRN
  Administered 2014-05-20 – 2014-05-21 (×3): 2 via ORAL
  Filled 2014-05-20 (×2): qty 2

## 2014-05-20 MED ORDER — MIDAZOLAM HCL 5 MG/5ML IJ SOLN
INTRAMUSCULAR | Status: DC | PRN
  Administered 2014-05-20 (×2): 1 mg via INTRAVENOUS

## 2014-05-20 MED ORDER — ONDANSETRON HCL 4 MG/2ML IJ SOLN
4.0000 mg | INTRAMUSCULAR | Status: DC | PRN
  Administered 2014-05-20 – 2014-05-22 (×4): 4 mg via INTRAVENOUS
  Filled 2014-05-20 (×3): qty 2

## 2014-05-20 MED ORDER — ALUM & MAG HYDROXIDE-SIMETH 200-200-20 MG/5ML PO SUSP
30.0000 mL | Freq: Four times a day (QID) | ORAL | Status: DC | PRN
  Administered 2014-05-21: 30 mL via ORAL
  Filled 2014-05-20: qty 30

## 2014-05-20 MED ORDER — CEFAZOLIN SODIUM 1-5 GM-% IV SOLN
INTRAVENOUS | Status: AC
  Filled 2014-05-20: qty 50

## 2014-05-20 MED ORDER — SODIUM CHLORIDE 0.9 % IJ SOLN
INTRAMUSCULAR | Status: AC
  Filled 2014-05-20: qty 10

## 2014-05-20 MED ORDER — DOCUSATE SODIUM 100 MG PO CAPS
100.0000 mg | ORAL_CAPSULE | Freq: Two times a day (BID) | ORAL | Status: DC
  Administered 2014-05-20 – 2014-05-23 (×6): 100 mg via ORAL
  Filled 2014-05-20 (×6): qty 1

## 2014-05-20 MED ORDER — CYCLOBENZAPRINE HCL 10 MG PO TABS
ORAL_TABLET | ORAL | Status: AC
  Filled 2014-05-20: qty 1

## 2014-05-20 MED ORDER — BUPIVACAINE HCL (PF) 0.25 % IJ SOLN
INTRAMUSCULAR | Status: DC | PRN
Start: 2014-05-20 — End: 2014-05-20
  Administered 2014-05-20: 5 mL

## 2014-05-20 MED ORDER — VECURONIUM BROMIDE 10 MG IV SOLR
INTRAVENOUS | Status: DC | PRN
  Administered 2014-05-20 (×2): 3 mg via INTRAVENOUS
  Administered 2014-05-20: 4 mg via INTRAVENOUS

## 2014-05-20 MED ORDER — SURGIFOAM 100 EX MISC
CUTANEOUS | Status: DC | PRN
  Administered 2014-05-20 (×2): via TOPICAL

## 2014-05-20 MED ORDER — FENTANYL CITRATE 0.05 MG/ML IJ SOLN
INTRAMUSCULAR | Status: AC
  Filled 2014-05-20: qty 5

## 2014-05-20 MED ORDER — LIDOCAINE-EPINEPHRINE 1 %-1:100000 IJ SOLN
INTRAMUSCULAR | Status: DC | PRN
  Administered 2014-05-20: 5 mL

## 2014-05-20 MED ORDER — PANTOPRAZOLE SODIUM 40 MG PO TBEC
40.0000 mg | DELAYED_RELEASE_TABLET | Freq: Every day | ORAL | Status: DC
  Administered 2014-05-21 – 2014-05-23 (×3): 40 mg via ORAL
  Filled 2014-05-20 (×4): qty 1

## 2014-05-20 MED ORDER — CEFAZOLIN SODIUM-DEXTROSE 2-3 GM-% IV SOLR
INTRAVENOUS | Status: AC
  Filled 2014-05-20: qty 50

## 2014-05-20 MED ORDER — OXYCODONE HCL 5 MG PO TABS
5.0000 mg | ORAL_TABLET | Freq: Once | ORAL | Status: AC | PRN
  Administered 2014-05-20: 5 mg via ORAL

## 2014-05-20 MED ORDER — PROPOFOL 10 MG/ML IV BOLUS
INTRAVENOUS | Status: DC | PRN
  Administered 2014-05-20: 200 mg via INTRAVENOUS

## 2014-05-20 MED ORDER — 0.9 % SODIUM CHLORIDE (POUR BTL) OPTIME
TOPICAL | Status: DC | PRN
  Administered 2014-05-20: 1000 mL

## 2014-05-20 MED ORDER — ONDANSETRON HCL 4 MG/2ML IJ SOLN
INTRAMUSCULAR | Status: DC | PRN
  Administered 2014-05-20: 4 mg via INTRAVENOUS

## 2014-05-20 MED ORDER — ALBUMIN HUMAN 5 % IV SOLN
INTRAVENOUS | Status: DC | PRN
  Administered 2014-05-20: 15:00:00 via INTRAVENOUS

## 2014-05-20 MED ORDER — CEFAZOLIN SODIUM 1-5 GM-% IV SOLN
1.0000 g | Freq: Three times a day (TID) | INTRAVENOUS | Status: AC
  Administered 2014-05-21 (×2): 1 g via INTRAVENOUS
  Filled 2014-05-20 (×2): qty 50

## 2014-05-20 MED ORDER — HYDROMORPHONE HCL PF 1 MG/ML IJ SOLN
0.5000 mg | INTRAMUSCULAR | Status: DC | PRN
  Administered 2014-05-20 – 2014-05-21 (×2): 1 mg via INTRAVENOUS
  Filled 2014-05-20 (×2): qty 1

## 2014-05-20 MED ORDER — LIDOCAINE HCL (CARDIAC) 20 MG/ML IV SOLN
INTRAVENOUS | Status: AC
  Filled 2014-05-20: qty 5

## 2014-05-20 MED ORDER — CYCLOBENZAPRINE HCL 10 MG PO TABS
10.0000 mg | ORAL_TABLET | Freq: Three times a day (TID) | ORAL | Status: DC | PRN
  Administered 2014-05-20: 10 mg via ORAL

## 2014-05-20 MED ORDER — ONDANSETRON HCL 4 MG/2ML IJ SOLN
INTRAMUSCULAR | Status: AC
  Filled 2014-05-20: qty 2

## 2014-05-20 MED ORDER — PHENYLEPHRINE HCL 10 MG/ML IJ SOLN
10.0000 mg | INTRAMUSCULAR | Status: DC | PRN
  Administered 2014-05-20: 30 ug/min via INTRAVENOUS

## 2014-05-20 MED ORDER — GLYCOPYRROLATE 0.2 MG/ML IJ SOLN
INTRAMUSCULAR | Status: AC
  Filled 2014-05-20: qty 3

## 2014-05-20 MED ORDER — CEFAZOLIN SODIUM 10 G IJ SOLR
3.0000 g | Freq: Once | INTRAMUSCULAR | Status: AC
  Administered 2014-05-20 (×2): 3 g via INTRAVENOUS

## 2014-05-20 MED ORDER — MENTHOL 3 MG MT LOZG: 1.0000 | LOZENGE | OROMUCOSAL | Status: DC | PRN

## 2014-05-20 MED ORDER — NEOSTIGMINE METHYLSULFATE 10 MG/10ML IV SOLN
INTRAVENOUS | Status: AC
  Filled 2014-05-20: qty 1

## 2014-05-20 MED ORDER — SODIUM CHLORIDE 0.9 % IJ SOLN: 3.0000 mL | INTRAMUSCULAR | Status: DC | PRN

## 2014-05-20 MED ORDER — NITROGLYCERIN 0.4 MG SL SUBL: 0.4000 mg | SUBLINGUAL_TABLET | SUBLINGUAL | Status: DC | PRN

## 2014-05-20 MED ORDER — POTASSIUM CHLORIDE IN NACL 20-0.9 MEQ/L-% IV SOLN
INTRAVENOUS | Status: DC
  Administered 2014-05-21 – 2014-05-23 (×2): via INTRAVENOUS
  Filled 2014-05-20 (×2): qty 1000

## 2014-05-20 MED ORDER — OXYCODONE HCL ER 15 MG PO T12A
15.0000 mg | EXTENDED_RELEASE_TABLET | Freq: Two times a day (BID) | ORAL | Status: DC
  Administered 2014-05-20 – 2014-05-23 (×6): 15 mg via ORAL
  Filled 2014-05-20 (×6): qty 1

## 2014-05-20 MED ORDER — SODIUM CHLORIDE 0.9 % IJ SOLN
3.0000 mL | Freq: Two times a day (BID) | INTRAMUSCULAR | Status: DC
  Administered 2014-05-21 – 2014-05-23 (×4): 3 mL via INTRAVENOUS

## 2014-05-20 MED ORDER — LACTATED RINGERS IV SOLN
INTRAVENOUS | Status: DC
  Administered 2014-05-20 (×4): via INTRAVENOUS

## 2014-05-20 MED ORDER — GLYCOPYRROLATE 0.2 MG/ML IJ SOLN
INTRAMUSCULAR | Status: DC | PRN
  Administered 2014-05-20: .8 mg via INTRAVENOUS

## 2014-05-20 MED ORDER — AMLODIPINE BESY-BENAZEPRIL HCL 5-20 MG PO CAPS: 1.0000 | ORAL_CAPSULE | Freq: Every day | ORAL | Status: DC

## 2014-05-20 MED ORDER — VECURONIUM BROMIDE 10 MG IV SOLR
INTRAVENOUS | Status: AC
  Filled 2014-05-20: qty 10

## 2014-05-20 MED ORDER — PROMETHAZINE HCL 25 MG/ML IJ SOLN: 6.2500 mg | INTRAMUSCULAR | Status: DC | PRN

## 2014-05-20 MED ORDER — OXYCODONE HCL 5 MG/5ML PO SOLN: 5.0000 mg | Freq: Once | ORAL | Status: AC | PRN

## 2014-05-20 MED ORDER — ARTIFICIAL TEARS OP OINT
TOPICAL_OINTMENT | OPHTHALMIC | Status: AC
  Filled 2014-05-20: qty 3.5

## 2014-05-20 MED ORDER — SODIUM CHLORIDE 0.9 % IV SOLN: 250.0000 mL | INTRAVENOUS | Status: DC

## 2014-05-20 MED ORDER — BENAZEPRIL HCL 20 MG PO TABS
20.0000 mg | ORAL_TABLET | Freq: Every day | ORAL | Status: DC
  Administered 2014-05-21 – 2014-05-22 (×2): 20 mg via ORAL
  Filled 2014-05-20 (×3): qty 1

## 2014-05-20 MED ORDER — LIDOCAINE HCL (CARDIAC) 20 MG/ML IV SOLN
INTRAVENOUS | Status: DC | PRN
  Administered 2014-05-20: 80 mg via INTRAVENOUS

## 2014-05-20 MED ORDER — FENTANYL CITRATE 0.05 MG/ML IJ SOLN
INTRAMUSCULAR | Status: DC | PRN
  Administered 2014-05-20 (×2): 100 ug via INTRAVENOUS
  Administered 2014-05-20 (×3): 50 ug via INTRAVENOUS

## 2014-05-20 MED ORDER — ACETAMINOPHEN 650 MG RE SUPP: 650.0000 mg | RECTAL | Status: DC | PRN

## 2014-05-20 MED ORDER — ROCURONIUM BROMIDE 50 MG/5ML IV SOLN
INTRAVENOUS | Status: AC
  Filled 2014-05-20: qty 1

## 2014-05-20 MED ORDER — DEXAMETHASONE SODIUM PHOSPHATE 4 MG/ML IJ SOLN
INTRAMUSCULAR | Status: DC | PRN
  Administered 2014-05-20: 8 mg via INTRAVENOUS

## 2014-05-20 MED ORDER — ACETAMINOPHEN 325 MG PO TABS: 650.0000 mg | ORAL_TABLET | ORAL | Status: DC | PRN

## 2014-05-20 MED ORDER — OXYCODONE HCL 5 MG PO TABS
ORAL_TABLET | ORAL | Status: AC
  Administered 2014-05-20: 5 mg via ORAL
  Filled 2014-05-20: qty 1

## 2014-05-20 MED ORDER — MIDAZOLAM HCL 2 MG/2ML IJ SOLN
INTRAMUSCULAR | Status: AC
  Filled 2014-05-20: qty 2

## 2014-05-20 MED ORDER — CYCLOBENZAPRINE HCL 10 MG PO TABS: 10.0000 mg | ORAL_TABLET | Freq: Three times a day (TID) | ORAL | Status: DC | PRN

## 2014-05-20 MED ORDER — OXYCODONE-ACETAMINOPHEN 5-325 MG PO TABS
1.0000 | ORAL_TABLET | ORAL | Status: DC | PRN
  Administered 2014-05-22 – 2014-05-23 (×3): 2 via ORAL
  Filled 2014-05-20 (×4): qty 2

## 2014-05-20 MED ORDER — PHENOL 1.4 % MT LIQD: 1.0000 | OROMUCOSAL | Status: DC | PRN

## 2014-05-20 MED ORDER — PROCHLORPERAZINE 25 MG RE SUPP
25.0000 mg | Freq: Two times a day (BID) | RECTAL | Status: DC | PRN
  Filled 2014-05-20: qty 1

## 2014-05-20 MED ORDER — NEOSTIGMINE METHYLSULFATE 10 MG/10ML IV SOLN
INTRAVENOUS | Status: DC | PRN
  Administered 2014-05-20: 5 mg via INTRAVENOUS

## 2014-05-20 MED ORDER — DEXAMETHASONE SODIUM PHOSPHATE 4 MG/ML IJ SOLN
INTRAMUSCULAR | Status: AC
  Filled 2014-05-20: qty 2

## 2014-05-20 MED ORDER — AMLODIPINE BESYLATE 5 MG PO TABS
5.0000 mg | ORAL_TABLET | Freq: Every day | ORAL | Status: DC
  Administered 2014-05-21 – 2014-05-22 (×2): 5 mg via ORAL
  Filled 2014-05-20 (×3): qty 1

## 2014-05-20 MED ORDER — HYDROMORPHONE HCL PF 1 MG/ML IJ SOLN
INTRAMUSCULAR | Status: AC
  Administered 2014-05-20: 0.5 mg via INTRAVENOUS
  Filled 2014-05-20: qty 2

## 2014-05-20 MED ORDER — HYDROMORPHONE HCL PF 1 MG/ML IJ SOLN
0.2500 mg | INTRAMUSCULAR | Status: DC | PRN
  Administered 2014-05-20 (×4): 0.5 mg via INTRAVENOUS

## 2014-05-20 MED ORDER — PROPOFOL 10 MG/ML IV BOLUS
INTRAVENOUS | Status: AC
  Filled 2014-05-20: qty 20

## 2014-05-20 SURGICAL SUPPLY — 87 items
ADH SKN CLS APL DERMABOND .7 (GAUZE/BANDAGES/DRESSINGS) ×1
ADH SKN CLS LQ APL DERMABOND (GAUZE/BANDAGES/DRESSINGS) ×1
APL SKNCLS STERI-STRIP NONHPOA (GAUZE/BANDAGES/DRESSINGS) ×1
BAG DECANTER FOR FLEXI CONT (MISCELLANEOUS) ×2 IMPLANT
BENZOIN TINCTURE PRP APPL 2/3 (GAUZE/BANDAGES/DRESSINGS) ×2 IMPLANT
BIT DRILL 5.0/4.0 (BIT) IMPLANT
BLADE SURG 11 STRL SS (BLADE) ×2 IMPLANT
BLADE SURG ROTATE 9660 (MISCELLANEOUS) IMPLANT
BONE ALLOSTEM MORSELIZED 5CC (Bone Implant) ×1 IMPLANT
BRUSH SCRUB EZ PLAIN DRY (MISCELLANEOUS) ×2 IMPLANT
BUR MATCHSTICK NEURO 3.0 LAGG (BURR) ×2 IMPLANT
BUR PRECISION FLUTE 6.0 (BURR) ×2 IMPLANT
CAGE RISE 11-17-15 10X26 (Cage) ×2 IMPLANT
CANISTER SUCT 3000ML (MISCELLANEOUS) ×2 IMPLANT
CAP LOCKING (Cap) ×6 IMPLANT
CAP LOCKING 5.5 CREO (Cap) IMPLANT
CONT SPEC 4OZ CLIKSEAL STRL BL (MISCELLANEOUS) ×4 IMPLANT
COVER BACK TABLE 24X17X13 BIG (DRAPES) IMPLANT
COVER TABLE BACK 60X90 (DRAPES) ×2 IMPLANT
DECANTER SPIKE VIAL GLASS SM (MISCELLANEOUS) ×2 IMPLANT
DERMABOND ADHESIVE PROPEN (GAUZE/BANDAGES/DRESSINGS) ×1
DERMABOND ADVANCED (GAUZE/BANDAGES/DRESSINGS) ×1
DERMABOND ADVANCED .7 DNX12 (GAUZE/BANDAGES/DRESSINGS) ×1 IMPLANT
DERMABOND ADVANCED .7 DNX6 (GAUZE/BANDAGES/DRESSINGS) IMPLANT
DRAPE C-ARM 42X72 X-RAY (DRAPES) ×4 IMPLANT
DRAPE LAPAROTOMY 100X72X124 (DRAPES) ×2 IMPLANT
DRAPE POUCH INSTRU U-SHP 10X18 (DRAPES) ×2 IMPLANT
DRAPE PROXIMA HALF (DRAPES) IMPLANT
DRAPE SURG 17X23 STRL (DRAPES) ×2 IMPLANT
DRILL 5.0/4.0 (BIT) ×2
DRSG OPSITE 4X5.5 SM (GAUZE/BANDAGES/DRESSINGS) ×3 IMPLANT
DRSG OPSITE POSTOP 4X8 (GAUZE/BANDAGES/DRESSINGS) ×1 IMPLANT
DURAPREP 26ML APPLICATOR (WOUND CARE) ×2 IMPLANT
ELECT BLADE 4.0 EZ CLEAN MEGAD (MISCELLANEOUS) ×2
ELECT REM PT RETURN 9FT ADLT (ELECTROSURGICAL) ×2
ELECTRODE BLDE 4.0 EZ CLN MEGD (MISCELLANEOUS) IMPLANT
ELECTRODE REM PT RTRN 9FT ADLT (ELECTROSURGICAL) ×1 IMPLANT
EVACUATOR 3/16  PVC DRAIN (DRAIN) ×2
EVACUATOR 3/16 PVC DRAIN (DRAIN) ×1 IMPLANT
GAUZE SPONGE 4X4 12PLY STRL (GAUZE/BANDAGES/DRESSINGS) ×2 IMPLANT
GAUZE SPONGE 4X4 16PLY XRAY LF (GAUZE/BANDAGES/DRESSINGS) ×1 IMPLANT
GLOVE BIO SURGEON STRL SZ8 (GLOVE) ×4 IMPLANT
GLOVE BIOGEL PI IND STRL 7.5 (GLOVE) IMPLANT
GLOVE BIOGEL PI INDICATOR 7.5 (GLOVE) ×2
GLOVE ECLIPSE 6.0 STRL STRAW (GLOVE) ×1 IMPLANT
GLOVE ECLIPSE 7.5 STRL STRAW (GLOVE) ×3 IMPLANT
GLOVE EXAM NITRILE LRG STRL (GLOVE) IMPLANT
GLOVE EXAM NITRILE MD LF STRL (GLOVE) IMPLANT
GLOVE EXAM NITRILE XL STR (GLOVE) IMPLANT
GLOVE EXAM NITRILE XS STR PU (GLOVE) IMPLANT
GLOVE INDICATOR 8.0 STRL GRN (GLOVE) ×2 IMPLANT
GLOVE INDICATOR 8.5 STRL (GLOVE) ×4 IMPLANT
GOWN STRL REUS W/ TWL LRG LVL3 (GOWN DISPOSABLE) IMPLANT
GOWN STRL REUS W/ TWL XL LVL3 (GOWN DISPOSABLE) ×2 IMPLANT
GOWN STRL REUS W/TWL 2XL LVL3 (GOWN DISPOSABLE) IMPLANT
GOWN STRL REUS W/TWL LRG LVL3 (GOWN DISPOSABLE) ×2
GOWN STRL REUS W/TWL XL LVL3 (GOWN DISPOSABLE) ×8
IMPL SPINE RISE 8-15-10-26M (Neuro Prosthesis/Implant) IMPLANT
IMPLANT SPINE RISE 8-15-10-26M (Neuro Prosthesis/Implant) ×4 IMPLANT
KIT BASIN OR (CUSTOM PROCEDURE TRAY) ×2 IMPLANT
KIT ROOM TURNOVER OR (KITS) ×2 IMPLANT
MILL MEDIUM DISP (BLADE) ×1 IMPLANT
NDL HYPO 25X1 1.5 SAFETY (NEEDLE) ×1 IMPLANT
NEEDLE HYPO 25X1 1.5 SAFETY (NEEDLE) ×2 IMPLANT
NS IRRIG 1000ML POUR BTL (IV SOLUTION) ×2 IMPLANT
PACK LAMINECTOMY NEURO (CUSTOM PROCEDURE TRAY) ×2 IMPLANT
PAD ARMBOARD 7.5X6 YLW CONV (MISCELLANEOUS) ×6 IMPLANT
PATTIES SURGICAL 1X1 (DISPOSABLE) ×1 IMPLANT
ROD 65MM SPINAL (Rod) ×2 IMPLANT
ROD SPNL 5.5 CREO TI 65 (Rod) IMPLANT
SCREW CORT CREO 6.5-5.5X35 (Screw) ×2 IMPLANT
SCREW MOD 6.0-5.0X35MM (Screw) ×2 IMPLANT
SCREW PREASSEMBLY 6.0-5.0X35 (Screw) ×2 IMPLANT
SPONGE LAP 4X18 X RAY DECT (DISPOSABLE) IMPLANT
SPONGE SURGIFOAM ABS GEL 100 (HEMOSTASIS) ×2 IMPLANT
STRIP CLOSURE SKIN 1/2X4 (GAUZE/BANDAGES/DRESSINGS) ×3 IMPLANT
SUT VIC AB 0 CT1 18XCR BRD8 (SUTURE) ×1 IMPLANT
SUT VIC AB 0 CT1 8-18 (SUTURE) ×2
SUT VIC AB 2-0 CT1 18 (SUTURE) ×3 IMPLANT
SUT VICRYL 4-0 PS2 18IN ABS (SUTURE) ×2 IMPLANT
SYR 20ML ECCENTRIC (SYRINGE) ×2 IMPLANT
TOWEL OR 17X24 6PK STRL BLUE (TOWEL DISPOSABLE) ×2 IMPLANT
TOWEL OR 17X26 10 PK STRL BLUE (TOWEL DISPOSABLE) ×2 IMPLANT
TRAY FOLEY CATH 14FRSI W/METER (CATHETERS) ×2 IMPLANT
TRAY FOLEY CATH 16FRSI W/METER (SET/KITS/TRAYS/PACK) ×1 IMPLANT
TULIP CREP AMP 5.5MM (Orthopedic Implant) ×2 IMPLANT
WATER STERILE IRR 1000ML POUR (IV SOLUTION) ×2 IMPLANT

## 2014-05-20 NOTE — Transfer of Care (Signed)
Immediate Anesthesia Transfer of Care Note  Patient: Brad [name]  Procedure(s) Performed: Procedure(s): LUMBAR FOUR TO FIVE, LUMBAR FIVE TO SACRAL ONE POSTERIOR LUMBAR FUSION 2 LEVEL   (N/A)  Patient Location: PACU  Anesthesia Type:General  Level of Consciousness: awake, alert  and oriented  Airway & Oxygen Therapy: Patient Spontanous Breathing and Patient connected to nasal cannula oxygen  Post-op Assessment: Report given to PACU RN, Post -op Vital signs reviewed and stable and Patient moving all extremities X 4  Post vital signs: Reviewed and stable  Complications: No apparent anesthesia complications

## 2014-05-20 NOTE — Op Note (Signed)
Preoperative diagnosis: Recurrent herniated nucleus pulposus L4-5 left with severe lumbar spinal stenosis L4-5 left and bilateral pars defects L5 and a grade 1 dynamic spondylolisthesis L5-S1  Postoperative diagnosis: Same  Procedure: #1 redo decompressive lumbar laminectomy L4-5 complete medial facetectomies radical foraminotomies in excess and requiring more work than would be needed with a standard interbody fusion  #2 decompressive lumbar laminectomy L5-S1 completely of facetectomies radical foraminotomies in excess and requiring more work than would be needed with a standard interbody fusion  #3 posterior lumbar interbody fusion L4-5 L5-S1 using the globus rise expandable peek cage packed with local autograft mixed with Allostem morsels  #4 cortical screw placement using the globus Creo modular cortical screw set L4-S1 with 50 by 35 mm screws at L4 and L5 and 5 5 x 35 mm at S1  #5 placement of a large Hemovac drain  Surgeon: Dominica Severin Jenalee Trevizo  Assistant: Ashok Pall  Anesthesia: Gen.  EBL: Minimal  History of present illness: Patient is a very pleasant 48 year old some is a progress worsening back and predominantly left leg pain consistent with an L5 nerve root pattern. Undergone 2 previous laminectomies and discectomies this level and had a second recurrent disc herniation at L4-5 and a workup flexor center films also showed a pars defect at L5 and dynamic spondylolisthesis L5-S1. Due to patient's failure conservative treatment imaging findings and progression clinical syndrome I recommended redo decompressive laminectomy L4-5 and fusion as well as decompressive laminectomy L5-S1 and fusion. I extensively reviewed the risks and benefits of the operation with the patient as well as perioperative course and expectations of outcome and alternatives of surgery and he understood and agreed to proceed forward.  Operative procedure: Patient brought into the or was induced under general anesthesia  positioned prone the Wilson frame his back was prepped and draped in routine sterile fashion his old incision was opened up and extended cephalad caudally and then the scar tissues dissected free and subperiosteal dissections care lamina of L3 L4-L5 and S1 bilaterally. I exposed the lateral pars and facet complexes at L3-4 L4-5 and L5-S1 and then using AP lateral fluoroscopy I confirmed the level and then under AP fluoroscopy identified to and 2.5 and 7:00 positions respectively on the L4 pedicle and then the using a slightly lateral and slightly superior trajectory under fluoroscopy I drilled holes in the L4 pedicle probe tapped with a 5-0 tap probed again and then placed 50 by 35 mm screws in each pedicle. Each pedicle probe and a 3 and 60 orientation was competent all screws excellent purchase after the 2 superior screws in place stents taken a decompressive laminectomy the spinolaminar complexes L5 was markedly hypermobile pars defect was immediately visualized at L5 and so I removed the spinous processes at L4 and L5 central decompression was begun working initially on the patient's right side the native territory performed complete medial facetectomies radical foraminotomies of the L4-L5 and S1 nerve roots. There is marked granulation tissue and stenosis and the L5 nerve root on the right due to the inflammatory reaction around the spondylolisthesis and pars defect. Working on the left the L5-S1 disc space on the left was exposed and a working from inferiorly and superiorly down I free of all scar tissue decompressing the left side at L4-5 performing a complete medial facetectomy and fraying of both the L4 and L5 nerve roots and scar tissue. After the decompression been completed attention second the interbody work first working at L4-5 disc space was incised bilaterally I placed  a distractor on the patient's left side size 9 mm this felt to be good sizing of the implants were chosen 8 expandable rise cage  10 lordosis and after a at the cleaning of the disc space on the patient's right side at L4-5 using paddle shavers Epstein curettes and school curettes I place the cage and expanded approximately 6 turns opening of the disc space at L4-5. I removed the distractor in a similar fashion a perform the same operation at L5-S1 however inserted 12 mm distractor and L5-S1 and used an 11 mm 15 expandable cage. The cage packed with local autograft mixed with Allostem morsels and then attention second the left side working at L5-S1 I placed the contralateral cage after packing aggressively the local autograft mixed centrally and after adequate endplate preparation placed and the left-sided cage. Then working into the scar tissue I removed a recurrent disc herniation underneath the L5 nerve root on the left cleaned out the disc radically prepare the endplates packed local autograft mixed centrally and then the left-sided cage was inserted and expanded and a similar fashion. Fluoroscopy confirmed good position of all the implant so far. At this point is taken to the placement of the cortical screws at L5 and S1 both holes were drilled to 35 mm probed tapped probed again and 5.0 x 35 mm screws placed at L5 bilaterally and 5.5 x 35 mm was placed at S1 bilaterally. Postop AP lateral fluoroscopy confirmed good position of screws and implants was then copious irrigated meticulous hemostasis was maintained all the foraminal reinspected confirm patency no migration of graft material Gelfoam was overlaid top of the dura 2 rods were then lordosis and inserted top tightness to her were placed and torqued down a large director was placed and the wounds closed in layers with after Vicryl and the skin was closed running 4 subcuticular Dermabond benzo and Steri-Strips sterile dressings were applied patient recovered in stable condition. At the end of case all needle counts sponge counts were correct.

## 2014-05-20 NOTE — Anesthesia Procedure Notes (Signed)
Procedure Name: Intubation Date/Time: 05/20/2014 11:52 AM Performed by: Kyung Rudd Pre-anesthesia Checklist: Patient identified, Emergency Drugs available, Suction available, Patient being monitored and Timeout performed Patient Re-evaluated:Patient Re-evaluated prior to inductionOxygen Delivery Method: Circle system utilized Preoxygenation: Pre-oxygenation with 100% oxygen Intubation Type: IV induction Ventilation: Mask ventilation without difficulty Laryngoscope Size: Mac and 4 Grade View: Grade I Tube type: Oral Tube size: 7.5 mm Number of attempts: 1 Airway Equipment and Method: Stylet Placement Confirmation: ETT inserted through vocal cords under direct vision,  positive ETCO2 and breath sounds checked- equal and bilateral Secured at: 22 cm Tube secured with: Tape Dental Injury: Teeth and Oropharynx as per pre-operative assessment

## 2014-05-20 NOTE — Anesthesia Preprocedure Evaluation (Addendum)
Anesthesia Evaluation  Patient identified by MRN, date of birth, ID band Patient awake    Reviewed: Allergy & Precautions, H&P , NPO status , Patient's Chart, lab work & pertinent test results  Airway Mallampati: II TM Distance: >3 FB Neck ROM: Full    Dental  (+) Teeth Intact, Dental Advisory Given   Pulmonary neg pulmonary ROS,  breath sounds clear to auscultation        Cardiovascular hypertension, Pt. on medications Rhythm:Regular Rate:Normal  Hx atypical CP. Negative Cath in 2012 with patent coronaries. Negative exercise stress test in 05/2013 obtaining 10 METS without EKG changes or anginal symptoms.   Neuro/Psych negative neurological ROS  negative psych ROS   GI/Hepatic Neg liver ROS, GERD-  Medicated,  Endo/Other  Morbid obesity  Renal/GU negative Renal ROS     Musculoskeletal negative musculoskeletal ROS (+)   Abdominal   Peds  Hematology negative hematology ROS (+)   Anesthesia Other Findings   Reproductive/Obstetrics negative OB ROS                          Anesthesia Physical Anesthesia Plan  ASA: II  Anesthesia Plan: General   Post-op Pain Management:    Induction: Intravenous  Airway Management Planned: Oral ETT  Additional Equipment: None  Intra-op Plan:   Post-operative Plan: Extubation in OR  Informed Consent: I have reviewed the patients History and Physical, chart, labs and discussed the procedure including the risks, benefits and alternatives for the proposed anesthesia with the patient or authorized representative who has indicated his/her understanding and acceptance.   Dental advisory given  Plan Discussed with: CRNA and Anesthesiologist  Anesthesia Plan Comments:         Anesthesia Quick Evaluation

## 2014-05-20 NOTE — Anesthesia Postprocedure Evaluation (Signed)
  Anesthesia Post-op Note  Patient: Brad [name]  Procedure(s) Performed: Procedure(s): LUMBAR FOUR TO FIVE, LUMBAR FIVE TO SACRAL ONE POSTERIOR LUMBAR FUSION 2 LEVEL   (N/A)  Patient Location: PACU  Anesthesia Type:General  Level of Consciousness: awake  Airway and Oxygen Therapy: Patient Spontanous Breathing  Post-op Pain: mild  Post-op Assessment: Post-op Vital signs reviewed  Post-op Vital Signs: Reviewed  Last Vitals:  Filed Vitals:   05/20/14 1700  BP: 119/77  Pulse: 90  Temp:   Resp: 19    Complications: No apparent anesthesia complications

## 2014-05-20 NOTE — H&P (Signed)
Brad [name] is an 48 y.o. male.   Chief Complaint: Back and left leg pain HPI: Patient is a very pleasant 48 year old gentleman who's had progressive worsening back and left leg pain on now for several months and years he's undergone 2 previous laminectomy and discectomies and now has recurred a second time for his third disc herniation L4-5 on the left. Patient refractory anti-inflammatories physical therapy and steroids in addition workup revealed pars defect at L5 and a spondylolisthesis at L5-S1. So because is a conservative treatment progression of clinical syndrome and imaging findings have recommended decompression stabilization procedure at L4-5 and L5-S1. I extensively reviewed the risks and benefits of the operation the patient as well as perioperative course expectations of outcome and alternatives surgery and he understands and agrees to proceed forward.  Past Medical History  Diagnosis Date  . Chest pain, unspecified   . Unspecified essential hypertension   . Obesity, unspecified   . Other chronic nonalcoholic liver disease   . Other abnormal blood chemistry   . GERD (gastroesophageal reflux disease)     Past Surgical History  Procedure Laterality Date  . Hernia repair      CHILD  . Cholecystectomy    . Lumbar laminectomy/decompression microdiscectomy  11/29/2011    Procedure: LUMBAR LAMINECTOMY/DECOMPRESSION MICRODISCECTOMY;  Surgeon: Elaina Hoops, MD;  Location: [facility];  Service: Neurosurgery;  Laterality: N/A;  Lumbar Laminectomy/Discectomy Decompression Thoracic Tweleve-Lumbar One, Lumbar Four-Five    Family History  Problem Relation Age of Onset  . Coronary artery disease      FATHER  . Hypertension Mother    Social History:  reports that he has never smoked. He has never used smokeless tobacco. He reports that he does not drink alcohol or use illicit drugs.  Allergies: No Known Allergies  Medications Prior to Admission  Medication Sig Dispense Refill  .  amLODipine-benazepril (LOTREL) 5-20 MG per capsule Take 1 capsule by mouth daily.        . cyclobenzaprine (FLEXERIL) 10 MG tablet Take 10 mg by mouth 3 (three) times daily as needed for muscle spasms.      . OxyCODONE (OXYCONTIN) 15 mg T12A 12 hr tablet Take 15 mg by mouth every 12 (twelve) hours.      Marland Kitchen oxyCODONE-acetaminophen (PERCOCET) 10-325 MG per tablet Take 1 tablet by mouth every 4 (four) hours as needed for pain.      . pantoprazole (PROTONIX) 40 MG tablet Take 40 mg by mouth daily.      . nitroGLYCERIN (NITROSTAT) 0.4 MG SL tablet Place 0.4 mg under the tongue every 5 (five) minutes as needed. For chest pain.        No results found for this or any previous visit (from the past 48 hour(s)). No results found.  Review of Systems  Constitutional: Negative.   HENT: Negative.   Eyes: Negative.   Respiratory: Negative.   Cardiovascular: Negative.   Gastrointestinal: Negative.   Genitourinary: Negative.   Musculoskeletal: Positive for back pain, joint pain and myalgias.  Neurological: Positive for tingling and sensory change.  Endo/Heme/Allergies: Negative.   Psychiatric/Behavioral: Negative.     Blood pressure 136/87, pulse 83, temperature 97.5 F (36.4 C), temperature source Oral, resp. rate 18, height 6\' 3"  (1.905 m), weight 126.735 kg (279 lb 6.4 oz), SpO2 97.00%. Physical Exam  Constitutional: He is oriented to person, place, and time. He appears well-developed and well-nourished.  HENT:  Head: Normocephalic.  Eyes: Pupils are equal, round, and reactive to light.  Neck: Normal range of motion.  Respiratory: Effort normal.  GI: Soft. Bowel sounds are normal.  Neurological: He is alert and oriented to person, place, and time. He has normal strength. GCS eye subscore is 4. GCS verbal subscore is 5. GCS motor subscore is 6.  Reflex Scores:      Patellar reflexes are 2+ on the right side and 0 on the left side.      Achilles reflexes are 2+ on the right side and 0 on the  left side. Strength is 5 out of 5 in his iliopsoas, quads, hamstrings, gastric him into tibialis, and EHL.  Skin: Skin is warm and dry.     Assessment/Plan 48yo gentleman presents  for an L4-5 L5-S1 decompression and fusion  [name] 05/20/2014, 11:40 AM

## 2014-05-21 ENCOUNTER — Encounter (HOSPITAL_COMMUNITY): Payer: Self-pay

## 2014-05-21 MED FILL — Heparin Sodium (Porcine) Inj 1000 Unit/ML: INTRAMUSCULAR | Qty: 30 | Status: AC

## 2014-05-21 MED FILL — Sodium Chloride IV Soln 0.9%: INTRAVENOUS | Qty: 2000 | Status: AC

## 2014-05-21 NOTE — Progress Notes (Signed)
Subjective: Patient reports No leg pain, back stiffness but well-controlled this morning  Objective: Vital signs in last 24 hours: Temp:  [97.5 F (36.4 C)-98.5 F (36.9 C)] 98.3 F (36.8 C) (09/03 0502) Pulse Rate:  [83-102] 91 (09/03 0502) Resp:  [15-19] 18 (09/03 0502) BP: (100-136)/(60-87) 109/67 mmHg (09/03 0502) SpO2:  [94 %-97 %] 94 % (09/03 0502) Weight:  [126.735 kg (279 lb 6.4 oz)] 126.735 kg (279 lb 6.4 oz) (09/02 1012)  Intake/Output from previous day: 09/02 0701 - 09/03 0700 In: 3600 [I.V.:3000; Blood:350; IV Piggyback:250] Out: 2230 [Urine:1200; Drains:30; Blood:1000] Intake/Output this shift:    Strength 5 out of 5 wound clean dry and intact  Lab Results: No results found for this basename: WBC, HGB, HCT, PLT,  in the last 72 hours BMET No results found for this basename: NA, K, CL, CO2, GLUCOSE, BUN, CREATININE, CALCIUM,  in the last 72 hours  Studies/Results: Dg Lumbar Spine 2-3 Views  05/20/2014   CLINICAL DATA:  48 year old male undergoing lumbar surgery. Initial encounter.  EXAM: DG C-ARM 61-120 MIN; LUMBAR SPINE - 2-3 VIEW  TECHNIQUE: Intraoperative fluoroscopic AP and lateral views of the lower lumbar spine.  CONTRAST:  None.  FLUOROSCOPY TIME:  2 min and 7 seconds.  COMPARISON:  lumbar radiographs 03/13/2014, and earlier  FINDINGS: Normal lumbar segmentation depicted on 03/13/2014. These images demonstrate placement of transpedicular and interbody fusion hardware at the L4-L5 and L5-S1 levels. Posterior connecting rods are not yet in place. Evidence of posterior decompression.  IMPRESSION: L4-L5 and L5-S1 posterior and interbody fusion underway.   Electronically Signed   By: Lars Pinks M.D.   On: 05/20/2014 16:21   Dg C-arm 61-120 Min  05/20/2014   CLINICAL DATA:  48 year old male undergoing lumbar surgery. Initial encounter.  EXAM: DG C-ARM 61-120 MIN; LUMBAR SPINE - 2-3 VIEW  TECHNIQUE: Intraoperative fluoroscopic AP and lateral views of the lower lumbar spine.   CONTRAST:  None.  FLUOROSCOPY TIME:  2 min and 7 seconds.  COMPARISON:  lumbar radiographs 03/13/2014, and earlier  FINDINGS: Normal lumbar segmentation depicted on 03/13/2014. These images demonstrate placement of transpedicular and interbody fusion hardware at the L4-L5 and L5-S1 levels. Posterior connecting rods are not yet in place. Evidence of posterior decompression.  IMPRESSION: L4-L5 and L5-S1 posterior and interbody fusion underway.   Electronically Signed   By: Lars Pinks M.D.   On: 05/20/2014 16:21    Assessment/Plan: Mobilize with physical and occupational therapy DC his Foley this a.m. obtain his brace and ambulate  LOS: 1 day     Brad [name] 05/21/2014, 7:35 AM

## 2014-05-21 NOTE — Progress Notes (Signed)
CARE MANAGEMENT NOTE 05/21/2014  Patient:  Brad [name], Brad [name]   Account Number:  [id number]  Date Initiated:  05/21/2014  Documentation initiated by:  Olga Coaster  Subjective/Objective Assessment:   ADMITTED FOR SURGERY     Action/Plan:   CM FOLLOWING FOR DCP   Anticipated DC Date:  05/26/2014   Anticipated DC Plan:  AWAITING FOR PT/OT EVALS FOR DISPOSITION NEEDS WITH ATTENDING MD APPROVAL     DC Planning Services  CM consult         Status of service:  In process, will continue to follow Medicare Important Message given?   (If response is "NO", the following Medicare IM given date fields will be blank)  Per UR Regulation:  Reviewed for med. necessity/level of care/duration of stay  Comments:  9/3/2015Mindi Slicker RN,BSN,MHA [phone]

## 2014-05-22 MED ORDER — CEFAZOLIN SODIUM-DEXTROSE 2-3 GM-% IV SOLR
2.0000 g | Freq: Three times a day (TID) | INTRAVENOUS | Status: DC
  Administered 2014-05-22 – 2014-05-23 (×4): 2 g via INTRAVENOUS
  Filled 2014-05-22 (×6): qty 50

## 2014-05-22 MED ORDER — BISACODYL 10 MG RE SUPP
10.0000 mg | Freq: Every day | RECTAL | Status: DC | PRN
  Administered 2014-05-23: 10 mg via RECTAL
  Filled 2014-05-22: qty 1

## 2014-05-22 MED ORDER — PROCHLORPERAZINE 25 MG RE SUPP
25.0000 mg | Freq: Two times a day (BID) | RECTAL | Status: DC | PRN
  Filled 2014-05-22: qty 1

## 2014-05-22 NOTE — Progress Notes (Signed)
Pt complained of stomach pain and feeling as if "stomach is turning". Gave Maalox at 2321. At [place] patient stated that his stomach still hurt and he felt nauseous. After giving zofran at 0049 pt vomited 20 minutes later. It was a moderate amount. No blood in emesis. Helped pt back to bed. Will continue to monitor.

## 2014-05-22 NOTE — Progress Notes (Signed)
Subjective: Patient reports He is feeling a little better with regard to his back and leg pain however having nausea and increased Donald distention discomfort  Objective: Vital signs in last 24 hours: Temp:  [97.6 F (36.4 C)-99 F (37.2 C)] 98.2 F (36.8 C) (09/04 0609) Pulse Rate:  [66-83] 73 (09/04 0609) Resp:  [16-18] 18 (09/04 0609) BP: (100-115)/(56-69) 105/56 mmHg (09/04 0609) SpO2:  [92 %-97 %] 92 % (09/04 0609)  Intake/Output from previous day: 09/03 0701 - 09/04 0700 In: 36 [P.O.:480; I.V.:3] Out: 365 [Drains:365] Intake/Output this shift:    Strength out of 5 wound clean dry and intact  Lab Results: No results found for this basename: WBC, HGB, HCT, PLT,  in the last 72 hours BMET No results found for this basename: NA, K, CL, CO2, GLUCOSE, BUN, CREATININE, CALCIUM,  in the last 72 hours  Studies/Results: Dg Lumbar Spine 2-3 Views  05/20/2014   CLINICAL DATA:  48 year old male undergoing lumbar surgery. Initial encounter.  EXAM: DG C-ARM 61-120 MIN; LUMBAR SPINE - 2-3 VIEW  TECHNIQUE: Intraoperative fluoroscopic AP and lateral views of the lower lumbar spine.  CONTRAST:  None.  FLUOROSCOPY TIME:  2 min and 7 seconds.  COMPARISON:  lumbar radiographs 03/13/2014, and earlier  FINDINGS: Normal lumbar segmentation depicted on 03/13/2014. These images demonstrate placement of transpedicular and interbody fusion hardware at the L4-L5 and L5-S1 levels. Posterior connecting rods are not yet in place. Evidence of posterior decompression.  IMPRESSION: L4-L5 and L5-S1 posterior and interbody fusion underway.   Electronically Signed   By: Lars Pinks M.D.   On: 05/20/2014 16:21   Dg C-arm 61-120 Min  05/20/2014   CLINICAL DATA:  48 year old male undergoing lumbar surgery. Initial encounter.  EXAM: DG C-ARM 61-120 MIN; LUMBAR SPINE - 2-3 VIEW  TECHNIQUE: Intraoperative fluoroscopic AP and lateral views of the lower lumbar spine.  CONTRAST:  None.  FLUOROSCOPY TIME:  2 min and 7 seconds.   COMPARISON:  lumbar radiographs 03/13/2014, and earlier  FINDINGS: Normal lumbar segmentation depicted on 03/13/2014. These images demonstrate placement of transpedicular and interbody fusion hardware at the L4-L5 and L5-S1 levels. Posterior connecting rods are not yet in place. Evidence of posterior decompression.  IMPRESSION: L4-L5 and L5-S1 posterior and interbody fusion underway.   Electronically Signed   By: Lars Pinks M.D.   On: 05/20/2014 16:21    Assessment/Plan: Continue PT and OT was clear liquids but take it slow with solids more than likely patient has beginnings of a postoperative ileus we'll place NG tube becomes too uncomfortable for  LOS: 2 days      Amontae Ng P 05/22/2014, 8:45 AM

## 2014-05-23 MED ORDER — FLEET ENEMA 7-19 GM/118ML RE ENEM
1.0000 | ENEMA | Freq: Once | RECTAL | Status: AC
  Administered 2014-05-23: 16:00:00 via RECTAL

## 2014-05-23 MED ORDER — CYCLOBENZAPRINE HCL 10 MG PO TABS: 10.0000 mg | ORAL_TABLET | Freq: Three times a day (TID) | ORAL | Status: AC | PRN

## 2014-05-23 MED ORDER — SENNA 8.6 MG PO TABS
1.0000 | ORAL_TABLET | Freq: Every day | ORAL | Status: DC
  Administered 2014-05-23: 8.6 mg via ORAL
  Filled 2014-05-23 (×2): qty 1

## 2014-05-23 MED ORDER — POLYETHYLENE GLYCOL 3350 17 G PO PACK
17.0000 g | PACK | Freq: Every day | ORAL | Status: DC
  Administered 2014-05-23: 17 g via ORAL
  Filled 2014-05-23: qty 1

## 2014-05-23 MED ORDER — OXYCODONE-ACETAMINOPHEN 5-325 MG PO TABS: 1.0000 | ORAL_TABLET | ORAL | Status: DC | PRN

## 2014-05-23 NOTE — Progress Notes (Signed)
Patient ID: Brad [name], male   DOB: 1966/01/16, 48 y.o.   MRN: [id number] Patient doing well significant proven back and leg pain  Strength out of 5 wound clean dry  Discharge home

## 2014-05-23 NOTE — Discharge Instructions (Signed)
No lifting no bending no twisting no driving a riding a car unless he is coming back and forth to see me. Keep incision clean and dry cover with Saran wrap for showers remove the outer dressing 2-3 days leave the Steri-Strips on and intact

## 2014-05-23 NOTE — Progress Notes (Signed)
1655- discharge instructions and prescriptions given to pt and pt's family members. Pt verbally acknowledged understanding. Pt requested script for oxycontin po 15 mg bid. Nurse pagd Dr. Saintclair Halsted. No return phone call. Informed pt to follow-up. Pt stated ok and content. Iv catheter removed by nurse without difficulty, intact. Pt tolerated well. Nurse to transport pt per wheelchair to lobby. Pt's family member to provide transport to home by car  Angeline Slim I 05/23/2014 5:35 PM

## 2014-05-23 NOTE — Progress Notes (Signed)
1650 - pt had a bowel movement post-fleet enema admin. Moderate amount of brown semi-formed stool observed in toilet. Pt stated no discomfort. abd soft, non-tender, bowel sounds present in 4 quadrants. Pt denies n/v Angeline Slim I 05/23/2014 5:00 PM

## 2014-05-23 NOTE — Discharge Summary (Signed)
  Physician Discharge Summary  Patient ID: Brad [name] MRN: [id number] DOB/AGE: 1966-05-15 48 y.o.  Admit date: 05/20/2014 Discharge date: 05/23/2014  Admission Diagnoses: Recurrent disc herniation L4-5 grade 1 spondylolisthesis and spondylolysis L5-S1  Discharge Diagnoses: Same Active Problems:   Spondylolisthesis at L5-S1 level   Discharged Condition: good  Hospital Course: Patient admitted hospital underwent decompression stabilization procedure from L4-S1 postoperative patient did very well recovered in the floor on the floor he was angling and voiding spontaneously tolerating regular diet stable for discharge home will be discharged scheduled followup  Consults: Significant Diagnostic Studies: Treatments: L4-5 decompression fusion Discharge Exam: Blood pressure 105/66, pulse 81, temperature 98.2 F (36.8 C), temperature source Oral, resp. rate 18, height 6\' 3"  (1.905 m), weight 126.735 kg (279 lb 6.4 oz), SpO2 95.00%. Strength out of 5 wound clean and dry  Disposition: Home  Discharge Instructions   Care order/instruction    Complete by:  As directed             Medication List         amLODipine-benazepril 5-20 MG per capsule  Commonly known as:  LOTREL  Take 1 capsule by mouth daily.     cyclobenzaprine 10 MG tablet  Commonly known as:  FLEXERIL  Take 10 mg by mouth 3 (three) times daily as needed for muscle spasms.     cyclobenzaprine 10 MG tablet  Commonly known as:  FLEXERIL  Take 1 tablet (10 mg total) by mouth 3 (three) times daily as needed for muscle spasms.     nitroGLYCERIN 0.4 MG SL tablet  Commonly known as:  NITROSTAT  Place 0.4 mg under the tongue every 5 (five) minutes as needed. For chest pain.     OxyCODONE 15 mg T12a 12 hr tablet  Commonly known as:  OXYCONTIN  Take 15 mg by mouth every 12 (twelve) hours.     oxyCODONE-acetaminophen 10-325 MG per tablet  Commonly known as:  PERCOCET  Take 1 tablet by mouth every 4 (four) hours as  needed for pain.     oxyCODONE-acetaminophen 5-325 MG per tablet  Commonly known as:  PERCOCET/ROXICET  Take 1-2 tablets by mouth every 4 (four) hours as needed for moderate pain.     pantoprazole 40 MG tablet  Commonly known as:  PROTONIX  Take 40 mg by mouth daily.           Follow-up Information   Follow up with [facility] P, MD.   Specialty:  Neurosurgery   Contact information:   [address] [place] [address] [phone]       Signed: Chau Sawin P 05/23/2014, 10:17 AM

## 2014-05-23 NOTE — Progress Notes (Signed)
1135 - hemovac removed by Cherre Huger, charge RN. Pt tolerated well. hemovac intact. No drainage noted from insertion site. No redness/swelling noted at insertion site. 2x2 and tegaderm applied to site. hemovac disposed of in biohazard container per policy. Will monitor Brad [name] I 05/23/2014 12:38 PM

## 2014-05-23 NOTE — Progress Notes (Signed)
Pt unable to have BM. Pt stated passing gas. Bowel sounds active x4 quadrants. abd round. Nurse admin senna, miralax, and bisacodyl suppository as ordered/charted. Will monitor Angeline Slim I 05/23/2014 2:43 PM

## 2014-05-25 LAB — TYPE AND SCREEN
ABO/RH(D): O NEG
ANTIBODY SCREEN: POSITIVE
DAT, IgG: POSITIVE
Donor AG Type: NEGATIVE
Donor AG Type: NEGATIVE
UNIT DIVISION: 0
Unit division: 0
Unit division: 0

## 2014-05-28 ENCOUNTER — Encounter (HOSPITAL_COMMUNITY): Payer: Self-pay | Admitting: Neurosurgery

## 2014-06-22 ENCOUNTER — Encounter (HOSPITAL_COMMUNITY): Payer: Self-pay | Admitting: Neurosurgery

## 2014-08-11 ENCOUNTER — Other Ambulatory Visit (HOSPITAL_COMMUNITY): Payer: Self-pay | Admitting: Neurosurgery

## 2014-08-11 DIAGNOSIS — M5126 Other intervertebral disc displacement, lumbar region: Secondary | ICD-10-CM

## 2014-08-17 ENCOUNTER — Ambulatory Visit (HOSPITAL_COMMUNITY)
Admission: RE | Admit: 2014-08-17 | Discharge: 2014-08-17 | Disposition: A | Discharge status: Home / Self Care | Payer: BC Managed Care – PPO | Source: Ambulatory Visit | Attending: Neurosurgery | Admitting: Neurosurgery

## 2014-08-17 DIAGNOSIS — M79605 Pain in left leg: Secondary | ICD-10-CM | POA: Insufficient documentation

## 2014-08-17 DIAGNOSIS — M545 Low back pain: Secondary | ICD-10-CM | POA: Diagnosis present

## 2014-08-17 DIAGNOSIS — M5126 Other intervertebral disc displacement, lumbar region: Secondary | ICD-10-CM

## 2014-08-17 LAB — POCT I-STAT CREATININE: Creatinine, Ser: 1.1 mg/dL (ref 0.50–1.35)

## 2014-08-17 MED ORDER — GADOBENATE DIMEGLUMINE 529 MG/ML IV SOLN
20.0000 mL | Freq: Once | INTRAVENOUS | Status: AC | PRN
  Administered 2014-08-17: 20 mL via INTRAVENOUS

## 2015-03-23 ENCOUNTER — Other Ambulatory Visit: Payer: Self-pay | Admitting: Neurosurgery

## 2015-03-23 DIAGNOSIS — M5137 Other intervertebral disc degeneration, lumbosacral region: Secondary | ICD-10-CM

## 2015-04-15 ENCOUNTER — Ambulatory Visit: Payer: BLUE CROSS/BLUE SHIELD

## 2015-06-09 ENCOUNTER — Emergency Department (HOSPITAL_COMMUNITY)
Admission: EM | Admit: 2015-06-09 | Discharge: 2015-06-09 | Disposition: A | Discharge status: Home / Self Care | Payer: BLUE CROSS/BLUE SHIELD | Attending: Emergency Medicine | Admitting: Emergency Medicine

## 2015-06-09 ENCOUNTER — Encounter (HOSPITAL_COMMUNITY): Payer: Self-pay | Admitting: Emergency Medicine

## 2015-06-09 DIAGNOSIS — Z79899 Other long term (current) drug therapy: Secondary | ICD-10-CM | POA: Diagnosis not present

## 2015-06-09 DIAGNOSIS — E669 Obesity, unspecified: Secondary | ICD-10-CM | POA: Insufficient documentation

## 2015-06-09 DIAGNOSIS — I10 Essential (primary) hypertension: Secondary | ICD-10-CM | POA: Diagnosis not present

## 2015-06-09 DIAGNOSIS — K219 Gastro-esophageal reflux disease without esophagitis: Secondary | ICD-10-CM | POA: Insufficient documentation

## 2015-06-09 DIAGNOSIS — R2231 Localized swelling, mass and lump, right upper limb: Secondary | ICD-10-CM | POA: Insufficient documentation

## 2015-06-09 LAB — CBG MONITORING, ED: Glucose-Capillary: 100 mg/dL — ABNORMAL HIGH (ref 65–99)

## 2015-06-09 MED ORDER — CEPHALEXIN 500 MG PO CAPS: 500.0000 mg | ORAL_CAPSULE | Freq: Four times a day (QID) | ORAL | Status: DC

## 2015-06-09 NOTE — ED Notes (Signed)
Pt from home with c/o swelling and redness to right AC starting this past Sunday.  Pt states it started out "feeling like a pulled muscle."  Pt reports pain minimal, redness increases and decreases on it's own and that the area is warm to touch.  Pt in NAD, A&O.

## 2015-06-09 NOTE — ED Notes (Signed)
Pt resting without distress; no needs.

## 2015-06-09 NOTE — ED Notes (Signed)
PT ambulated with baseline gait; VSS; A&Ox3; no signs of distress; respirations even and unlabored; skin warm and dry; no questions upon discharge.  

## 2015-06-09 NOTE — Discharge Instructions (Signed)
We are treating you with antibiotics for possible cellulitis Please follow-up with your PCP to ensure resolution of arm redness and mass Please return if symptoms worsen, fever, altered mental status, CP, or shortness of breath. Please also see below return precautions  Keep arm elevated   Cellulitis Cellulitis is an infection of the skin and the tissue under the skin. The infected area is usually red and tender. This happens most often in the arms and lower legs. HOME CARE   Take your antibiotic medicine as told. Finish the medicine even if you start to feel better.  Keep the infected arm or leg raised (elevated).  Put a warm cloth on the area up to 4 times per day.  Only take medicines as told by your doctor.  Keep all doctor visits as told. GET HELP IF:  You see red streaks on the skin coming from the infected area.  Your red area gets bigger or turns a dark color.  Your bone or joint under the infected area is painful after the skin heals.  Your infection comes back in the same area or different area.  You have a puffy (swollen) bump in the infected area.  You have new symptoms.  You have a fever. GET HELP RIGHT AWAY IF:   You feel very sleepy.  You throw up (vomit) or have watery poop (diarrhea).  You feel sick and have muscle aches and pains. MAKE SURE YOU:   Understand these instructions.  Will watch your condition.  Will get help right away if you are not doing well or get worse. Document Released: 02/21/2008 Document Revised: 01/19/2014 Document Reviewed: 11/20/2011 [facility] Patient Information 2015 [place], [place]. This information is not intended to replace advice given to you by your health care provider. Make sure you discuss any questions you have with your health care provider.

## 2015-06-09 NOTE — ED Notes (Signed)
Patient undressed, in gown, on monitor, continuous pulse oximetry and blood pressure cuff 

## 2015-06-09 NOTE — ED Provider Notes (Signed)
CSN: [id number]     Arrival date & time 06/09/15  0725 History   First MD Initiated Contact with Patient 06/09/15 0732     Chief Complaint  Patient presents with  . "knot" on right [facility]     HPI Brad [name] is a 49 y.o. male who presents to the ED with arm mass located in right arm. He states that mass first appeared Sunday evening. He denies any injury to the arm or known insect bites. He describes the discomfort as feeling like a pulled muscle. It has remained the same size. Painful only with movement of arm and touch (severity 2/10). He has no other similar lesions on his body. Never had before. It is warm to touch and erythematous.    Past Medical History  Diagnosis Date  . Chest pain, unspecified   . Unspecified essential hypertension   . Obesity, unspecified   . Other chronic nonalcoholic liver disease   . Other abnormal blood chemistry   . GERD (gastroesophageal reflux disease)    Past Surgical History  Procedure Laterality Date  . Hernia repair      CHILD  . Cholecystectomy    . Lumbar laminectomy/decompression microdiscectomy  11/29/2011    Procedure: LUMBAR LAMINECTOMY/DECOMPRESSION MICRODISCECTOMY;  Surgeon: Elaina Hoops, MD;  Location: [place] [facility];  Service: Neurosurgery;  Laterality: N/A;  Lumbar Laminectomy/Discectomy Decompression Thoracic Tweleve-Lumbar One, Lumbar Four-Five   Family History  Problem Relation Age of Onset  . Coronary artery disease      FATHER  . Hypertension Mother    Social History  Substance Use Topics  . Smoking status: Never Smoker   . Smokeless tobacco: Never Used  . Alcohol Use: No    Review of Systems  Constitutional: Negative for fever.  Respiratory: Negative for shortness of breath.   Cardiovascular: Negative for chest pain.  Gastrointestinal: Negative for abdominal pain.  Also per HPI  Allergies  Review of patient's allergies indicates no known allergies.  Home Medications   Prior to Admission medications   Medication  Sig Start Date End Date Taking? Authorizing Provider  amLODipine-benazepril (LOTREL) 5-20 MG per capsule Take 1 capsule by mouth daily.      Historical Provider, MD  cephALEXin (KEFLEX) 500 MG capsule Take 1 capsule (500 mg total) by mouth 4 (four) times daily. 06/09/15   Katheren Shams, DO  cyclobenzaprine (FLEXERIL) 10 MG tablet Take 10 mg by mouth 3 (three) times daily as needed for muscle spasms.    Historical Provider, MD  cyclobenzaprine (FLEXERIL) 10 MG tablet Take 1 tablet (10 mg total) by mouth 3 (three) times daily as needed for muscle spasms. 05/23/14   Kary Kos, MD  nitroGLYCERIN (NITROSTAT) 0.4 MG SL tablet Place 0.4 mg under the tongue every 5 (five) minutes as needed. For chest pain. 02/24/11   Ezra Sites, MD  OxyCODONE (OXYCONTIN) 15 mg T12A 12 hr tablet Take 15 mg by mouth every 12 (twelve) hours.    Historical Provider, MD  oxyCODONE-acetaminophen (PERCOCET) 10-325 MG per tablet Take 1 tablet by mouth every 4 (four) hours as needed for pain.    Historical Provider, MD  oxyCODONE-acetaminophen (PERCOCET/ROXICET) 5-325 MG per tablet Take 1-2 tablets by mouth every 4 (four) hours as needed for moderate pain. 05/23/14   Kary Kos, MD  pantoprazole (PROTONIX) 40 MG tablet Take 40 mg by mouth daily.    Historical Provider, MD   BP 117/84 mmHg  Pulse 76  Temp(Src) 97.9 F (36.6 C) (  Oral)  Resp 18  Ht 6\' 3"  (1.905 m)  Wt 270 lb (122.471 kg)  BMI 33.75 kg/m2  SpO2 98% Physical Exam  Constitutional: He is oriented to person, place, and time. He appears well-developed and well-nourished. No distress.  HENT:  Head: Normocephalic and atraumatic.  Eyes: EOM are normal.  Cardiovascular: Normal rate, regular rhythm, normal heart sounds and intact distal pulses.   Pulmonary/Chest: Effort normal and breath sounds normal.  Abdominal: Soft. Bowel sounds are normal. There is no tenderness.  Musculoskeletal: Normal range of motion.  Neurological: He is alert and oriented to person, place, and  time. He has normal strength. No sensory deficit.  Skin: Skin is warm, dry and intact.  Right medial upper arm with erythema and a 2" well-circumcised mass. No excessive warmth to area.    ED Course  Procedures (including critical care time) Labs Review Labs Reviewed  CBG MONITORING, ED - Abnormal; Notable for the following:    Glucose-Capillary 100 (*)    All other components within normal limits    Imaging Review No results found. I have personally reviewed and evaluated these images and lab results as part of my medical decision-making.   EKG Interpretation   Date/Time:  Wednesday June 09 2015 07:38:56 EDT Ventricular Rate:  82 PR Interval:  136 QRS Duration: 97 QT Interval:  388 QTC Calculation: 453 R Axis:   12 Text Interpretation:  Sinus rhythm Ventricular premature complex No  significant change since last tracing Confirmed by FLOYD MD, DANIEL  ([id number]) on 06/09/2015 7:44:22 AM      MDM   Final diagnoses:  Arm mass, right    Patient presented to ED with right arm mass. Etiology unknown. Has multiple scratches on arm. Could possibly be cellulitis with superficial erythema. Also cannot rule out possible hematoma but patient denies injury. No concerning red flags. Neurovascularly intact.   Will discharge patient with Keflex for cellulitis treatment. He is to follow-up with his PCP.    Luiz Blare, DO 06/09/2015, 9:08 AM PGY-2, [place], DO 06/09/15 [place], DO 06/09/15 [phone]

## 2015-06-09 NOTE — ED Notes (Signed)
Patient d/c'd from monitor, continuous pulse oximetry and blood pressure cuff; patient getting dressed to be discharged home 

## 2015-06-09 NOTE — ED Notes (Signed)
POCT CBG resulted 100

## 2015-10-04 ENCOUNTER — Other Ambulatory Visit: Payer: Self-pay | Admitting: Cardiology

## 2015-10-04 NOTE — Telephone Encounter (Signed)
°  1. Which medications need to be refilled? (please list name of each medication and dose if known) (LOTREL) 5-20 MG   2. Which pharmacy/location (including street and city if local pharmacy) is medication to be sent to?[address] , [place], [place]   3. Do they need a 30 day or 90 day supply?[place]

## 2015-10-05 ENCOUNTER — Other Ambulatory Visit: Payer: Self-pay | Admitting: *Deleted

## 2015-10-05 MED ORDER — AMLODIPINE BESY-BENAZEPRIL HCL 5-20 MG PO CAPS: 1.0000 | ORAL_CAPSULE | Freq: Every day | ORAL | Status: DC

## 2015-10-25 ENCOUNTER — Other Ambulatory Visit (HOSPITAL_COMMUNITY): Payer: Self-pay | Admitting: Neurosurgery

## 2015-10-25 DIAGNOSIS — M5137 Other intervertebral disc degeneration, lumbosacral region: Secondary | ICD-10-CM

## 2015-10-28 ENCOUNTER — Ambulatory Visit (HOSPITAL_COMMUNITY)
Admission: RE | Admit: 2015-10-28 | Discharge: 2015-10-28 | Disposition: A | Discharge status: Home / Self Care | Payer: BLUE CROSS/BLUE SHIELD | Source: Ambulatory Visit | Attending: Neurosurgery | Admitting: Neurosurgery

## 2015-10-28 DIAGNOSIS — M545 Low back pain: Secondary | ICD-10-CM | POA: Diagnosis not present

## 2015-10-28 DIAGNOSIS — Z981 Arthrodesis status: Secondary | ICD-10-CM | POA: Diagnosis not present

## 2015-10-28 DIAGNOSIS — M5137 Other intervertebral disc degeneration, lumbosacral region: Secondary | ICD-10-CM | POA: Insufficient documentation

## 2015-10-28 DIAGNOSIS — M47896 Other spondylosis, lumbar region: Secondary | ICD-10-CM | POA: Diagnosis not present

## 2015-10-28 LAB — POCT I-STAT CREATININE: Creatinine, Ser: 0.9 mg/dL (ref 0.61–1.24)

## 2015-10-28 MED ORDER — GADOBENATE DIMEGLUMINE 529 MG/ML IV SOLN
20.0000 mL | Freq: Once | INTRAVENOUS | Status: AC | PRN
Start: 2015-10-28 — End: 2015-10-28
  Administered 2015-10-28: 20 mL via INTRAVENOUS

## 2015-11-09 ENCOUNTER — Ambulatory Visit (INDEPENDENT_AMBULATORY_CARE_PROVIDER_SITE_OTHER): Payer: BLUE CROSS/BLUE SHIELD | Admitting: Cardiology

## 2015-11-09 ENCOUNTER — Encounter: Payer: Self-pay | Admitting: Cardiology

## 2015-11-09 VITALS — BP 123/83 | HR 98 | Ht 75.0 in | Wt 288.0 lb

## 2015-11-09 DIAGNOSIS — R7309 Other abnormal glucose: Secondary | ICD-10-CM

## 2015-11-09 DIAGNOSIS — R0789 Other chest pain: Secondary | ICD-10-CM | POA: Diagnosis not present

## 2015-11-09 DIAGNOSIS — I10 Essential (primary) hypertension: Secondary | ICD-10-CM | POA: Diagnosis not present

## 2015-11-09 MED ORDER — AMLODIPINE BESY-BENAZEPRIL HCL 5-20 MG PO CAPS: 1.0000 | ORAL_CAPSULE | Freq: Every day | ORAL | Status: DC

## 2015-11-09 NOTE — Patient Instructions (Signed)
Your physician recommends that you schedule a follow-up appointment AS NEEDED WITH DR. [place]  Your physician recommends that you continue on your current medications as directed. Please refer to the Current Medication list given to you today.  WE HAVE REFILLED YOUR MEDICATION 58 DAY SUPPLY  Your physician recommends that you return for lab work CMP/CBC/TSH/LIPIDS/MG/HGBA1C - PLEASE FAST FOR LAB WORK 6 HOURS PRIOR  WE HAVE GIVEN YOU A LIST OF PRIMARY DOCTORS   Thank you for choosing [facility]!!

## 2015-11-09 NOTE — Progress Notes (Signed)
Patient ID: DREYSON LEAMY, male   DOB: [birth year], 50 y.o.   MRN: [id number]     Clinical Summary Mr. Deitch is a 50 y.o.male seen today for follow up of the following medical problems.   1. Atypical chest pain - prior evaluation in 2012 w/ chest pain, lexiscan had possible defect, referred for cath. Jan 2012 cath had normal coronaries - no recurrent symptoms  Since last visit.   2. HTN - typically 120/80s at home - compliant with meds.      Past Medical History  Diagnosis Date  . Chest pain, unspecified   . Unspecified essential hypertension   . Obesity, unspecified   . Other chronic nonalcoholic liver disease   . Other abnormal blood chemistry   . GERD (gastroesophageal reflux disease)      No Known Allergies   Current Outpatient Prescriptions  Medication Sig Dispense Refill  . amLODipine-benazepril (LOTREL) 5-20 MG capsule Take 1 capsule by mouth daily. 30 capsule 1  . cephALEXin (KEFLEX) 500 MG capsule Take 1 capsule (500 mg total) by mouth 4 (four) times daily. 28 capsule 0  . cyclobenzaprine (FLEXERIL) 10 MG tablet Take 10 mg by mouth 3 (three) times daily as needed for muscle spasms.    . cyclobenzaprine (FLEXERIL) 10 MG tablet Take 1 tablet (10 mg total) by mouth 3 (three) times daily as needed for muscle spasms. 80 tablet 0  . nitroGLYCERIN (NITROSTAT) 0.4 MG SL tablet Place 0.4 mg under the tongue every 5 (five) minutes as needed. For chest pain.    . OxyCODONE (OXYCONTIN) 15 mg T12A 12 hr tablet Take 15 mg by mouth every 12 (twelve) hours.    Marland Kitchen oxyCODONE-acetaminophen (PERCOCET) 10-325 MG per tablet Take 1 tablet by mouth every 4 (four) hours as needed for pain.    Marland Kitchen oxyCODONE-acetaminophen (PERCOCET/ROXICET) 5-325 MG per tablet Take 1-2 tablets by mouth every 4 (four) hours as needed for moderate pain. 80 tablet 0  . pantoprazole (PROTONIX) 40 MG tablet Take 40 mg by mouth daily.     No current facility-administered medications for this visit.     Past  Surgical History  Procedure Laterality Date  . Hernia repair      CHILD  . Cholecystectomy    . Lumbar laminectomy/decompression microdiscectomy  11/29/2011    Procedure: LUMBAR LAMINECTOMY/DECOMPRESSION MICRODISCECTOMY;  Surgeon: Elaina Hoops, MD;  Location: [place] [facility];  Service: Neurosurgery;  Laterality: N/A;  Lumbar Laminectomy/Discectomy Decompression Thoracic Tweleve-Lumbar One, Lumbar Four-Five     No Known Allergies    Family History  Problem Relation Age of Onset  . Coronary artery disease      FATHER  . Hypertension Mother      Social History Mr. Exume reports that he has never smoked. He has never used smokeless tobacco. Mr. Verzosa reports that he does not drink alcohol.   Review of Systems CONSTITUTIONAL: No weight loss, fever, chills, weakness or fatigue.  HEENT: Eyes: No visual loss, blurred vision, double vision or yellow sclerae.No hearing loss, sneezing, congestion, runny nose or sore throat.  SKIN: No rash or itching.  CARDIOVASCULAR: per hpi RESPIRATORY: No shortness of breath, cough or sputum.  GASTROINTESTINAL: No anorexia, nausea, vomiting or diarrhea. No abdominal pain or blood.  GENITOURINARY: No burning on urination, no polyuria NEUROLOGICAL: No headache, dizziness, syncope, paralysis, ataxia, numbness or tingling in the extremities. No change in bowel or bladder control.  MUSCULOSKELETAL: No muscle, back pain, joint pain or stiffness.  LYMPHATICS: No enlarged nodes. No  history of splenectomy.  PSYCHIATRIC: No history of depression or anxiety.  ENDOCRINOLOGIC: No reports of sweating, cold or heat intolerance. No polyuria or polydipsia.  Marland Kitchen   Physical Examination Filed Vitals:   11/09/15 1106  BP: 123/83  Pulse: 98   Filed Vitals:   11/09/15 1106  Height: 6\' 3"  (1.905 m)  Weight: 288 lb (130.636 kg)    Gen: resting comfortably, no acute distress HEENT: no scleral icterus, pupils equal round and reactive, no palptable cervical  adenopathy,  CV: RRR, no m/r/g, no jvd Resp: Clear to auscultation bilaterally GI: abdomen is soft, non-tender, non-distended, normal bowel sounds, no hepatosplenomegaly MSK: extremities are warm, no edema.  Skin: warm, no rash Neuro:  no focal deficits Psych: appropriate affect   Diagnostic Studies 06/02/13 Stress EKG: excercised 8 min(test stopped b/c at [facility], no b/c max functional capacity), no ischemic changes, Duke treadmill score 8 low risk    Assessment and Plan  1. Atypical chest pain - no recent symptoms, previous negative cardiac workup - continue to monitor   2 HTN  - at goal, continue current meds   F/u as needed. He does not have pcp at this time, we will check annual labs.      Arnoldo Lenis, M.D.

## 2015-11-25 ENCOUNTER — Encounter: Payer: Self-pay | Admitting: *Deleted

## 2015-12-01 ENCOUNTER — Other Ambulatory Visit: Payer: Self-pay | Admitting: *Deleted

## 2015-12-01 MED ORDER — AMLODIPINE BESY-BENAZEPRIL HCL 5-20 MG PO CAPS: 1.0000 | ORAL_CAPSULE | Freq: Every day | ORAL | Status: DC

## 2015-12-03 ENCOUNTER — Other Ambulatory Visit (HOSPITAL_COMMUNITY): Payer: Self-pay | Admitting: Neurosurgery

## 2015-12-03 DIAGNOSIS — M5137 Other intervertebral disc degeneration, lumbosacral region: Secondary | ICD-10-CM

## 2015-12-07 ENCOUNTER — Other Ambulatory Visit (HOSPITAL_COMMUNITY): Payer: BLUE CROSS/BLUE SHIELD

## 2015-12-10 ENCOUNTER — Ambulatory Visit (HOSPITAL_COMMUNITY)
Admission: RE | Admit: 2015-12-10 | Discharge: 2015-12-10 | Disposition: A | Discharge status: Home / Self Care | Payer: BLUE CROSS/BLUE SHIELD | Source: Ambulatory Visit | Attending: Neurosurgery | Admitting: Neurosurgery

## 2015-12-10 DIAGNOSIS — Z981 Arthrodesis status: Secondary | ICD-10-CM | POA: Insufficient documentation

## 2015-12-10 DIAGNOSIS — R938 Abnormal findings on diagnostic imaging of other specified body structures: Secondary | ICD-10-CM | POA: Diagnosis not present

## 2015-12-10 DIAGNOSIS — M5137 Other intervertebral disc degeneration, lumbosacral region: Secondary | ICD-10-CM | POA: Diagnosis present

## 2015-12-30 ENCOUNTER — Other Ambulatory Visit: Payer: Self-pay | Admitting: Neurosurgery

## 2016-01-10 ENCOUNTER — Encounter (HOSPITAL_COMMUNITY): Payer: Self-pay

## 2016-01-10 ENCOUNTER — Encounter (HOSPITAL_COMMUNITY)
Admission: RE | Admit: 2016-01-10 | Discharge: 2016-01-10 | Disposition: A | Discharge status: Home / Self Care | Payer: BLUE CROSS/BLUE SHIELD | Source: Ambulatory Visit | Attending: Neurosurgery | Admitting: Neurosurgery

## 2016-01-10 DIAGNOSIS — Z0183 Encounter for blood typing: Secondary | ICD-10-CM | POA: Diagnosis not present

## 2016-01-10 DIAGNOSIS — M960 Pseudarthrosis after fusion or arthrodesis: Secondary | ICD-10-CM | POA: Diagnosis not present

## 2016-01-10 DIAGNOSIS — Z01812 Encounter for preprocedural laboratory examination: Secondary | ICD-10-CM | POA: Insufficient documentation

## 2016-01-10 HISTORY — DX: Anxiety disorder, unspecified: F41.9

## 2016-01-10 LAB — BASIC METABOLIC PANEL
Anion gap: 8 (ref 5–15)
BUN: 10 mg/dL (ref 6–20)
CALCIUM: 9.6 mg/dL (ref 8.9–10.3)
CHLORIDE: 105 mmol/L (ref 101–111)
CO2: 27 mmol/L (ref 22–32)
CREATININE: 0.9 mg/dL (ref 0.61–1.24)
GFR calc non Af Amer: >60 mL/min (ref >60)
GLUCOSE: 80 mg/dL (ref 65–99)
Potassium: 4.3 mmol/L (ref 3.5–5.1)
Sodium: 140 mmol/L (ref 135–145)

## 2016-01-10 LAB — SURGICAL PCR SCREEN
MRSA, PCR: NEGATIVE
STAPHYLOCOCCUS AUREUS: POSITIVE — AB

## 2016-01-10 LAB — CBC
HEMATOCRIT: 43.9 % (ref 39.0–52.0)
HEMOGLOBIN: 15 g/dL (ref 13.0–17.0)
MCH: 29.8 pg (ref 26.0–34.0)
MCHC: 34.2 g/dL (ref 30.0–36.0)
MCV: 87.1 fL (ref 78.0–100.0)
Platelets: 147 10*3/uL — ABNORMAL LOW (ref 150–400)
RBC: 5.04 MIL/uL (ref 4.22–5.81)
RDW: 14.5 % (ref 11.5–15.5)
WBC: 5.9 10*3/uL (ref 4.0–10.5)

## 2016-01-10 NOTE — Progress Notes (Signed)
Anesthesia Chart Review:  Pt is a 50 year old male scheduled for removal of lumbar hardware, possible redo of posterolateral fusion on 01/17/2016 with Dr. Saintclair Halsted.   Cardiologist is Dr. Carlyle Dolly, last office visit 11/09/15 (seen for atypical chest pain, no work up indicated).   PMH includes:  HTN, chronic non-alcoholic liver disease, GERD. Never smoker. BMI 37.5. S/p posterior lumbar fusion 05/20/14. S/p lumbar laminectomy 11/29/11.   Medications include: amlodipine-benazepril.   Preoperative labs reviewed.  Will get HFP DOS.   EKG 06/09/15: Sinus rhythm. Ventricular premature complex  Exercise stress test 06/02/13 (labeled echo under CV proc tab):  - Normal stress test  Cardiac cath 10/18/10:  1. Normal coronary arteries. 2. Normal left ventricular function.  If labs acceptable DOS, I anticipate pt can proceed with surgery as scheduled.   Willeen Cass, FNP-BC [facility]/Anesthesiology Phone: [phone] 01/10/2016 4:40 PM

## 2016-01-16 MED ORDER — DEXTROSE 5 % IV SOLN
3.0000 g | INTRAVENOUS | Status: AC
  Administered 2016-01-17: 3 g via INTRAVENOUS
  Filled 2016-01-16: qty 3000

## 2016-01-17 ENCOUNTER — Inpatient Hospital Stay (HOSPITAL_COMMUNITY)
Admission: RE | Admit: 2016-01-17 | Discharge: 2016-01-18 | DRG: 460 | Disposition: A | Discharge status: Home / Self Care | Payer: BLUE CROSS/BLUE SHIELD | Source: Ambulatory Visit | Attending: Neurosurgery | Admitting: Neurosurgery

## 2016-01-17 ENCOUNTER — Inpatient Hospital Stay (HOSPITAL_COMMUNITY): Payer: BLUE CROSS/BLUE SHIELD | Admitting: Certified Registered Nurse Anesthetist

## 2016-01-17 ENCOUNTER — Inpatient Hospital Stay (HOSPITAL_COMMUNITY): Payer: BLUE CROSS/BLUE SHIELD | Admitting: Emergency Medicine

## 2016-01-17 ENCOUNTER — Encounter (HOSPITAL_COMMUNITY)
Admission: RE | Disposition: A | Discharge status: Home / Self Care | Payer: Self-pay | Source: Ambulatory Visit | Attending: Neurosurgery

## 2016-01-17 ENCOUNTER — Encounter (HOSPITAL_COMMUNITY): Payer: Self-pay | Admitting: Certified Registered Nurse Anesthetist

## 2016-01-17 DIAGNOSIS — S32009K Unspecified fracture of unspecified lumbar vertebra, subsequent encounter for fracture with nonunion: Secondary | ICD-10-CM | POA: Diagnosis present

## 2016-01-17 DIAGNOSIS — K219 Gastro-esophageal reflux disease without esophagitis: Secondary | ICD-10-CM | POA: Diagnosis present

## 2016-01-17 DIAGNOSIS — Y831 Surgical operation with implant of artificial internal device as the cause of abnormal reaction of the patient, or of later complication, without mention of misadventure at the time of the procedure: Secondary | ICD-10-CM | POA: Diagnosis present

## 2016-01-17 DIAGNOSIS — Z79899 Other long term (current) drug therapy: Secondary | ICD-10-CM | POA: Diagnosis not present

## 2016-01-17 DIAGNOSIS — I10 Essential (primary) hypertension: Secondary | ICD-10-CM | POA: Diagnosis present

## 2016-01-17 DIAGNOSIS — M960 Pseudarthrosis after fusion or arthrodesis: Principal | ICD-10-CM | POA: Diagnosis present

## 2016-01-17 DIAGNOSIS — T8484XA Pain due to internal orthopedic prosthetic devices, implants and grafts, initial encounter: Secondary | ICD-10-CM | POA: Diagnosis present

## 2016-01-17 DIAGNOSIS — M549 Dorsalgia, unspecified: Secondary | ICD-10-CM | POA: Diagnosis present

## 2016-01-17 LAB — HEPATIC FUNCTION PANEL
ALBUMIN: 3.9 g/dL (ref 3.5–5.0)
ALT: 68 U/L — AB (ref 17–63)
AST: 76 U/L — AB (ref 15–41)
Alkaline Phosphatase: 82 U/L (ref 38–126)
Bilirubin, Direct: 0.8 mg/dL — ABNORMAL HIGH (ref 0.1–0.5)
Indirect Bilirubin: 1.6 mg/dL — ABNORMAL HIGH (ref 0.3–0.9)
Total Bilirubin: 2.4 mg/dL — ABNORMAL HIGH (ref 0.3–1.2)
Total Protein: 6.7 g/dL (ref 6.5–8.1)

## 2016-01-17 LAB — TYPE AND SCREEN
ABO/RH(D): O NEG
ANTIBODY SCREEN: POSITIVE

## 2016-01-17 SURGERY — POSTERIOR LUMBAR FUSION 1 LEVEL
Anesthesia: General | Site: Back

## 2016-01-17 MED ORDER — OXYCODONE-ACETAMINOPHEN 5-325 MG PO TABS
1.0000 | ORAL_TABLET | ORAL | Status: DC | PRN
  Administered 2016-01-17 – 2016-01-18 (×4): 2 via ORAL
  Filled 2016-01-17 (×4): qty 2

## 2016-01-17 MED ORDER — FENTANYL CITRATE (PF) 100 MCG/2ML IJ SOLN
25.0000 ug | INTRAMUSCULAR | Status: DC | PRN
  Administered 2016-01-17 (×2): 50 ug via INTRAVENOUS

## 2016-01-17 MED ORDER — CEFAZOLIN SODIUM 1-5 GM-% IV SOLN
1.0000 g | Freq: Three times a day (TID) | INTRAVENOUS | Status: AC
  Administered 2016-01-17 – 2016-01-18 (×2): 1 g via INTRAVENOUS
  Filled 2016-01-17 (×2): qty 50

## 2016-01-17 MED ORDER — AMLODIPINE BESYLATE 5 MG PO TABS
5.0000 mg | ORAL_TABLET | Freq: Every day | ORAL | Status: DC
  Administered 2016-01-18: 5 mg via ORAL
  Filled 2016-01-17: qty 1

## 2016-01-17 MED ORDER — ONDANSETRON HCL 4 MG/2ML IJ SOLN
INTRAMUSCULAR | Status: DC | PRN
  Administered 2016-01-17: 4 mg via INTRAVENOUS

## 2016-01-17 MED ORDER — CYCLOBENZAPRINE HCL 10 MG PO TABS
10.0000 mg | ORAL_TABLET | Freq: Three times a day (TID) | ORAL | Status: DC | PRN
  Administered 2016-01-17 – 2016-01-18 (×2): 10 mg via ORAL
  Filled 2016-01-17 (×2): qty 1

## 2016-01-17 MED ORDER — SODIUM CHLORIDE 0.9% FLUSH
3.0000 mL | Freq: Two times a day (BID) | INTRAVENOUS | Status: DC
  Administered 2016-01-17: 3 mL via INTRAVENOUS

## 2016-01-17 MED ORDER — VANCOMYCIN HCL 1000 MG IV SOLR
INTRAVENOUS | Status: DC | PRN
  Administered 2016-01-17: 1000 mg

## 2016-01-17 MED ORDER — VANCOMYCIN HCL 1000 MG IV SOLR
INTRAVENOUS | Status: AC
  Filled 2016-01-17: qty 1000

## 2016-01-17 MED ORDER — LORATADINE 10 MG PO TABS
10.0000 mg | ORAL_TABLET | Freq: Every day | ORAL | Status: DC
  Administered 2016-01-18: 10 mg via ORAL
  Filled 2016-01-17: qty 1

## 2016-01-17 MED ORDER — SUCCINYLCHOLINE CHLORIDE 20 MG/ML IJ SOLN
INTRAMUSCULAR | Status: DC | PRN
  Administered 2016-01-17: 140 mg via INTRAVENOUS

## 2016-01-17 MED ORDER — DULOXETINE HCL 30 MG PO CPEP
30.0000 mg | ORAL_CAPSULE | Freq: Every day | ORAL | Status: DC
  Administered 2016-01-18: 30 mg via ORAL
  Filled 2016-01-17: qty 1

## 2016-01-17 MED ORDER — OXYCODONE HCL ER 15 MG PO T12A
15.0000 mg | EXTENDED_RELEASE_TABLET | Freq: Two times a day (BID) | ORAL | Status: DC
  Administered 2016-01-17 – 2016-01-18 (×2): 15 mg via ORAL
  Filled 2016-01-17 (×2): qty 1

## 2016-01-17 MED ORDER — MENTHOL 3 MG MT LOZG: 1.0000 | LOZENGE | OROMUCOSAL | Status: DC | PRN

## 2016-01-17 MED ORDER — BUPIVACAINE LIPOSOME 1.3 % IJ SUSP
INTRAMUSCULAR | Status: DC | PRN
  Administered 2016-01-17: 20 mL

## 2016-01-17 MED ORDER — BENAZEPRIL HCL 20 MG PO TABS
20.0000 mg | ORAL_TABLET | Freq: Every day | ORAL | Status: DC
  Administered 2016-01-18: 20 mg via ORAL
  Filled 2016-01-17: qty 1

## 2016-01-17 MED ORDER — MIDAZOLAM HCL 5 MG/5ML IJ SOLN
INTRAMUSCULAR | Status: DC | PRN
  Administered 2016-01-17: 2 mg via INTRAVENOUS

## 2016-01-17 MED ORDER — ONDANSETRON HCL 4 MG/2ML IJ SOLN: 4.0000 mg | INTRAMUSCULAR | Status: DC | PRN

## 2016-01-17 MED ORDER — LACTATED RINGERS IV SOLN: INTRAVENOUS | Status: DC

## 2016-01-17 MED ORDER — THROMBIN 20000 UNITS EX SOLR
CUTANEOUS | Status: DC | PRN
  Administered 2016-01-17: 12:00:00 via TOPICAL

## 2016-01-17 MED ORDER — 0.9 % SODIUM CHLORIDE (POUR BTL) OPTIME
TOPICAL | Status: DC | PRN
  Administered 2016-01-17: 1000 mL

## 2016-01-17 MED ORDER — ACETAMINOPHEN 325 MG PO TABS: 650.0000 mg | ORAL_TABLET | ORAL | Status: DC | PRN

## 2016-01-17 MED ORDER — FENTANYL CITRATE (PF) 100 MCG/2ML IJ SOLN
INTRAMUSCULAR | Status: DC | PRN
  Administered 2016-01-17 (×2): 100 ug via INTRAVENOUS

## 2016-01-17 MED ORDER — ROCURONIUM BROMIDE 100 MG/10ML IV SOLN
INTRAVENOUS | Status: DC | PRN
  Administered 2016-01-17: 50 mg via INTRAVENOUS

## 2016-01-17 MED ORDER — PROPOFOL 10 MG/ML IV BOLUS
INTRAVENOUS | Status: AC
  Filled 2016-01-17: qty 40

## 2016-01-17 MED ORDER — DEXAMETHASONE SODIUM PHOSPHATE 10 MG/ML IJ SOLN
INTRAMUSCULAR | Status: DC | PRN
  Administered 2016-01-17: 10 mg via INTRAVENOUS

## 2016-01-17 MED ORDER — ACETAMINOPHEN 650 MG RE SUPP: 650.0000 mg | RECTAL | Status: DC | PRN

## 2016-01-17 MED ORDER — DEXTROSE 5 % IV SOLN
10.0000 mg | INTRAVENOUS | Status: DC | PRN
  Administered 2016-01-17: 25 ug/min via INTRAVENOUS

## 2016-01-17 MED ORDER — LIDOCAINE HCL (CARDIAC) 20 MG/ML IV SOLN
INTRAVENOUS | Status: DC | PRN
  Administered 2016-01-17: 100 mg via INTRAVENOUS

## 2016-01-17 MED ORDER — ALBUTEROL SULFATE HFA 108 (90 BASE) MCG/ACT IN AERS
INHALATION_SPRAY | RESPIRATORY_TRACT | Status: DC | PRN
  Administered 2016-01-17: 5 via RESPIRATORY_TRACT

## 2016-01-17 MED ORDER — LACTATED RINGERS IV SOLN
INTRAVENOUS | Status: DC
  Administered 2016-01-17 (×2): via INTRAVENOUS

## 2016-01-17 MED ORDER — SODIUM CHLORIDE 0.9% FLUSH: 3.0000 mL | INTRAVENOUS | Status: DC | PRN

## 2016-01-17 MED ORDER — METOCLOPRAMIDE HCL 5 MG/ML IJ SOLN: 10.0000 mg | Freq: Once | INTRAMUSCULAR | Status: DC | PRN

## 2016-01-17 MED ORDER — PHENYLEPHRINE HCL 10 MG/ML IJ SOLN
INTRAMUSCULAR | Status: DC | PRN
  Administered 2016-01-17: 120 ug via INTRAVENOUS
  Administered 2016-01-17: 80 ug via INTRAVENOUS
  Administered 2016-01-17: 120 ug via INTRAVENOUS
  Administered 2016-01-17: 80 ug via INTRAVENOUS

## 2016-01-17 MED ORDER — AMLODIPINE BESY-BENAZEPRIL HCL 5-20 MG PO CAPS: 1.0000 | ORAL_CAPSULE | Freq: Every day | ORAL | Status: DC

## 2016-01-17 MED ORDER — SUGAMMADEX SODIUM 500 MG/5ML IV SOLN
INTRAVENOUS | Status: DC | PRN
  Administered 2016-01-17: 400 mg via INTRAVENOUS

## 2016-01-17 MED ORDER — PROPOFOL 10 MG/ML IV BOLUS
INTRAVENOUS | Status: DC | PRN
  Administered 2016-01-17: 200 mg via INTRAVENOUS

## 2016-01-17 MED ORDER — BUPIVACAINE LIPOSOME 1.3 % IJ SUSP
20.0000 mL | INTRAMUSCULAR | Status: DC
  Filled 2016-01-17: qty 20

## 2016-01-17 MED ORDER — FENTANYL CITRATE (PF) 100 MCG/2ML IJ SOLN
INTRAMUSCULAR | Status: AC
  Filled 2016-01-17: qty 2

## 2016-01-17 MED ORDER — ADULT MULTIVITAMIN W/MINERALS CH
1.0000 | ORAL_TABLET | Freq: Every day | ORAL | Status: DC
  Administered 2016-01-18: 1 via ORAL
  Filled 2016-01-17: qty 1

## 2016-01-17 MED ORDER — MEPERIDINE HCL 25 MG/ML IJ SOLN: 6.2500 mg | INTRAMUSCULAR | Status: DC | PRN

## 2016-01-17 MED ORDER — HYDROMORPHONE HCL 1 MG/ML IJ SOLN
0.5000 mg | INTRAMUSCULAR | Status: DC | PRN
  Administered 2016-01-17: 1 mg via INTRAVENOUS
  Filled 2016-01-17: qty 1

## 2016-01-17 MED ORDER — MIDAZOLAM HCL 2 MG/2ML IJ SOLN
INTRAMUSCULAR | Status: AC
  Filled 2016-01-17: qty 2

## 2016-01-17 MED ORDER — LIDOCAINE-EPINEPHRINE 1 %-1:100000 IJ SOLN
INTRAMUSCULAR | Status: DC | PRN
  Administered 2016-01-17: 10 mL

## 2016-01-17 MED ORDER — FENTANYL CITRATE (PF) 250 MCG/5ML IJ SOLN
INTRAMUSCULAR | Status: AC
  Filled 2016-01-17: qty 10

## 2016-01-17 MED ORDER — CYCLOBENZAPRINE HCL 10 MG PO TABS: 10.0000 mg | ORAL_TABLET | Freq: Three times a day (TID) | ORAL | Status: DC | PRN

## 2016-01-17 MED ORDER — EPHEDRINE SULFATE 50 MG/ML IJ SOLN
INTRAMUSCULAR | Status: DC | PRN
  Administered 2016-01-17: 5 mg via INTRAVENOUS
  Administered 2016-01-17: 10 mg via INTRAVENOUS

## 2016-01-17 MED ORDER — PHENOL 1.4 % MT LIQD: 1.0000 | OROMUCOSAL | Status: DC | PRN

## 2016-01-17 MED ORDER — SODIUM CHLORIDE 0.9 % IR SOLN
Status: DC | PRN
  Administered 2016-01-17: 12:00:00

## 2016-01-17 SURGICAL SUPPLY — 64 items
APL SKNCLS STERI-STRIP NONHPOA (GAUZE/BANDAGES/DRESSINGS) ×1
BAG DECANTER FOR FLEXI CONT (MISCELLANEOUS) ×2 IMPLANT
BENZOIN TINCTURE PRP APPL 2/3 (GAUZE/BANDAGES/DRESSINGS) ×2 IMPLANT
BLADE CLIPPER SURG (BLADE) IMPLANT
BLADE SURG 11 STRL SS (BLADE) ×2 IMPLANT
BRUSH SCRUB EZ PLAIN DRY (MISCELLANEOUS) ×2 IMPLANT
BUR MATCHSTICK NEURO 3.0 LAGG (BURR) ×2 IMPLANT
BUR PRECISION FLUTE 6.0 (BURR) ×2 IMPLANT
CANISTER SUCT 3000ML PPV (MISCELLANEOUS) ×2 IMPLANT
CONT SPEC 4OZ CLIKSEAL STRL BL (MISCELLANEOUS) ×2 IMPLANT
COVER BACK TABLE 60X90IN (DRAPES) ×2 IMPLANT
DECANTER SPIKE VIAL GLASS SM (MISCELLANEOUS) ×2 IMPLANT
DRAPE C-ARM 42X72 X-RAY (DRAPES) ×2 IMPLANT
DRAPE C-ARMOR (DRAPES) ×2 IMPLANT
DRAPE LAPAROTOMY 100X72X124 (DRAPES) ×2 IMPLANT
DRAPE POUCH INSTRU U-SHP 10X18 (DRAPES) ×2 IMPLANT
DRAPE PROXIMA HALF (DRAPES) IMPLANT
DRAPE SURG 17X23 STRL (DRAPES) ×2 IMPLANT
DRSG OPSITE 4X5.5 SM (GAUZE/BANDAGES/DRESSINGS) ×1 IMPLANT
DRSG OPSITE POSTOP 4X8 (GAUZE/BANDAGES/DRESSINGS) ×1 IMPLANT
DURAPREP 26ML APPLICATOR (WOUND CARE) ×2 IMPLANT
ELECT REM PT RETURN 9FT ADLT (ELECTROSURGICAL) ×2
ELECTRODE REM PT RTRN 9FT ADLT (ELECTROSURGICAL) ×1 IMPLANT
EVACUATOR 3/16  PVC DRAIN (DRAIN) ×1
EVACUATOR 3/16 PVC DRAIN (DRAIN) ×1 IMPLANT
GAUZE SPONGE 4X4 12PLY STRL (GAUZE/BANDAGES/DRESSINGS) ×2 IMPLANT
GAUZE SPONGE 4X4 16PLY XRAY LF (GAUZE/BANDAGES/DRESSINGS) IMPLANT
GLOVE BIO SURGEON STRL SZ8 (GLOVE) ×4 IMPLANT
GLOVE ECLIPSE 7.5 STRL STRAW (GLOVE) ×3 IMPLANT
GLOVE EXAM NITRILE LRG STRL (GLOVE) IMPLANT
GLOVE EXAM NITRILE MD LF STRL (GLOVE) IMPLANT
GLOVE EXAM NITRILE XL STR (GLOVE) IMPLANT
GLOVE EXAM NITRILE XS STR PU (GLOVE) IMPLANT
GLOVE INDICATOR 7.5 STRL GRN (GLOVE) ×2 IMPLANT
GLOVE INDICATOR 8.5 STRL (GLOVE) ×4 IMPLANT
GOWN STRL REUS W/ TWL LRG LVL3 (GOWN DISPOSABLE) IMPLANT
GOWN STRL REUS W/ TWL XL LVL3 (GOWN DISPOSABLE) ×2 IMPLANT
GOWN STRL REUS W/TWL 2XL LVL3 (GOWN DISPOSABLE) IMPLANT
GOWN STRL REUS W/TWL LRG LVL3 (GOWN DISPOSABLE)
GOWN STRL REUS W/TWL XL LVL3 (GOWN DISPOSABLE) ×4
KIT BASIN OR (CUSTOM PROCEDURE TRAY) ×2 IMPLANT
KIT INFUSE X SMALL 1.4CC (Orthopedic Implant) ×1 IMPLANT
KIT ROOM TURNOVER OR (KITS) ×2 IMPLANT
LIQUID BAND (GAUZE/BANDAGES/DRESSINGS) ×2 IMPLANT
NDL HYPO 21X1 ECLIPSE (NEEDLE) IMPLANT
NDL HYPO 25X1 1.5 SAFETY (NEEDLE) ×1 IMPLANT
NEEDLE HYPO 21X1 ECLIPSE (NEEDLE) ×2 IMPLANT
NEEDLE HYPO 25X1 1.5 SAFETY (NEEDLE) ×2 IMPLANT
NS IRRIG 1000ML POUR BTL (IV SOLUTION) ×2 IMPLANT
PACK FOAM VITOSS 10CC (Orthopedic Implant) ×2 IMPLANT
PACK LAMINECTOMY NEURO (CUSTOM PROCEDURE TRAY) ×2 IMPLANT
PAD ARMBOARD 7.5X6 YLW CONV (MISCELLANEOUS) ×6 IMPLANT
SPONGE LAP 4X18 X RAY DECT (DISPOSABLE) IMPLANT
SPONGE SURGIFOAM ABS GEL 100 (HEMOSTASIS) ×2 IMPLANT
STRIP CLOSURE SKIN 1/2X4 (GAUZE/BANDAGES/DRESSINGS) ×3 IMPLANT
SUT VIC AB 0 CT1 18XCR BRD8 (SUTURE) ×2 IMPLANT
SUT VIC AB 0 CT1 8-18 (SUTURE) ×4
SUT VIC AB 2-0 CT1 18 (SUTURE) ×2 IMPLANT
SUT VIC AB 4-0 PS2 27 (SUTURE) ×2 IMPLANT
SYR 20CC LL (SYRINGE) ×1 IMPLANT
TOWEL OR 17X24 6PK STRL BLUE (TOWEL DISPOSABLE) ×2 IMPLANT
TOWEL OR 17X26 10 PK STRL BLUE (TOWEL DISPOSABLE) ×2 IMPLANT
TRAY FOLEY W/METER SILVER 14FR (SET/KITS/TRAYS/PACK) ×2 IMPLANT
WATER STERILE IRR 1000ML POUR (IV SOLUTION) ×2 IMPLANT

## 2016-01-17 NOTE — Anesthesia Preprocedure Evaluation (Signed)
Anesthesia Evaluation  Patient identified by MRN, date of birth, ID band Patient awake    Reviewed: Allergy & Precautions, H&P , NPO status , Patient's Chart, lab work & pertinent test results  Airway Mallampati: II TM Distance: >3 FB Neck ROM: Full    Dental  (+) Teeth Intact, Dental Advisory Given   Pulmonary neg pulmonary ROS,  breath sounds clear to auscultation        Cardiovascular hypertension, Pt. on medications Rhythm:Regular Rate:Normal  Hx atypical CP. Negative Cath in 2012 with patent coronaries. Negative exercise stress test in 05/2013 obtaining 10 METS without EKG changes or anginal symptoms.   Neuro/Psych negative neurological ROS  negative psych ROS   GI/Hepatic Neg liver ROS, GERD-  Medicated,  Endo/Other  Morbid obesity  Renal/GU negative Renal ROS     Musculoskeletal negative musculoskeletal ROS (+)   Abdominal   Peds  Hematology negative hematology ROS (+)   Anesthesia Other Findings   Reproductive/Obstetrics negative OB ROS                          Anesthesia Physical Anesthesia Plan  ASA: II  Anesthesia Plan: General   Post-op Pain Management:    Induction: Intravenous  Airway Management Planned: Oral ETT  Additional Equipment: None  Intra-op Plan:   Post-operative Plan: Extubation in OR  Informed Consent: I have reviewed the patients History and Physical, chart, labs and discussed the procedure including the risks, benefits and alternatives for the proposed anesthesia with the patient or authorized representative who has indicated his/her understanding and acceptance.   Dental advisory given  Plan Discussed with: CRNA and Anesthesiologist  Anesthesia Plan Comments:         Anesthesia Quick Evaluation  

## 2016-01-17 NOTE — H&P (Signed)
Brad [name] is an 50 y.o. male.   Chief Complaint: Back pain HPI: Patient is a 50 year old Brad [name] has had a previous L4-S1 fusion year and half ago and has visited very well he progressed however start her syncopal to worsening back pain follow-up imaging with CT scan and plain films showing suggestion of loosening of his S1 cortical screws however does appear to be has at least some solid bone incorporating the interspace. However due to his persistent back pain is very extremity treatment imaging findings commended removal of hardware and redo posterior lateral fusion. Extensor gone over the risks and benefits of the operation the patient as well as periapical course expectations about, and alternatives of surgery and he understands and agrees to proceed forward.  Past Medical History  Diagnosis Date  . Chest pain, unspecified   . Unspecified essential hypertension   . Obesity, unspecified   . Other chronic nonalcoholic liver disease   . Other abnormal blood chemistry   . GERD (gastroesophageal reflux disease)   . Anxiety     Past Surgical History  Procedure Laterality Date  . Hernia repair      CHILD  . Cholecystectomy    . Lumbar laminectomy/decompression microdiscectomy  11/29/2011    Procedure: LUMBAR LAMINECTOMY/DECOMPRESSION MICRODISCECTOMY;  Surgeon: Elaina Hoops, MD;  Location: [place] [facility];  Service: Neurosurgery;  Laterality: N/A;  Lumbar Laminectomy/Discectomy Decompression Thoracic Tweleve-Lumbar One, Lumbar Four-Five  . Back surgery      x3    Family History  Problem Relation Age of Onset  . Coronary artery disease      FATHER  . Hypertension Mother    Social History:  reports that he has never smoked. He has never used smokeless tobacco. He reports that he does not drink alcohol or use illicit drugs.  Allergies: No Known Allergies  Medications Prior to Admission  Medication Sig Dispense Refill  . amLODipine-benazepril (LOTREL) 5-20 MG capsule Take 1 capsule by  mouth daily. 90 capsule 3  . cetirizine (ZYRTEC) 10 MG tablet Take 10 mg by mouth daily.    . cyclobenzaprine (FLEXERIL) 10 MG tablet Take 1 tablet (10 mg total) by mouth 3 (three) times daily as needed for muscle spasms. 80 tablet 0  . DULoxetine (CYMBALTA) 30 MG capsule Take 30 mg by mouth daily.    . Liniments (SALONPAS ARTHRITIS PAIN RELIEF EX) Apply 1 patch topically at bedtime as needed.    . Multiple Vitamins-Minerals (MULTIVITAMIN WITH MINERALS) tablet Take 1 tablet by mouth daily.    . OxyCODONE (OXYCONTIN) 15 mg T12A 12 hr tablet Take 15 mg by mouth every 6 (six) hours as needed.       Results for orders placed or performed during the hospital encounter of 01/17/16 (from the past 48 hour(s))  Hepatic function panel     Status: Abnormal   Collection Time: 01/17/16  9:30 AM  Result Value Ref Range   Total Protein 6.7 6.5 - 8.1 g/dL   Albumin 3.9 3.5 - 5.0 g/dL   AST 76 (H) 15 - 41 U/L   ALT 68 (H) 17 - 63 U/L   Alkaline Phosphatase 82 38 - 126 U/L   Total Bilirubin 2.4 (H) 0.3 - 1.2 mg/dL   Bilirubin, Direct 0.8 (H) 0.1 - 0.5 mg/dL   Indirect Bilirubin 1.6 (H) 0.3 - 0.9 mg/dL  Type and screen [place]     Status: None   Collection Time: 01/17/16  9:30 AM  Result Value Ref  Range   ABO/RH(D) O NEG    Antibody Screen POS    Sample Expiration 01/20/2016    No results found.  Review of Systems  Constitutional: Negative.   HENT: Negative.   Eyes: Negative.   Respiratory: Negative.   Cardiovascular: Negative.   Gastrointestinal: Negative.   Genitourinary: Negative.   Musculoskeletal: Positive for back pain and joint pain.  Skin: Negative.   Neurological: Positive for tingling and sensory change.  Endo/Heme/Allergies: Negative.   Psychiatric/Behavioral: Negative.     Blood pressure 116/71, pulse 79, temperature 97.7 F (36.5 C), temperature source Oral, resp. rate 20, height 6\' 3"  (1.905 m), weight 136.249 kg (300 lb 6 oz), SpO2 95 %. Physical Exam   Constitutional: He is oriented to person, place, and time. He appears well-developed and well-nourished.  HENT:  Head: Normocephalic.  Eyes: Pupils are equal, round, and reactive to light.  Neck: Normal range of motion.  GI: Soft.  Neurological: He is alert and oriented to person, place, and time. He has normal strength. GCS eye subscore is 4. GCS verbal subscore is 5. GCS motor subscore is 6.  Strength is 5 and has iliopsoas, quads, anxious, gastroc, and to tibialis, EHL.  Skin: Skin is warm and dry.     Assessment/Plan 50 year old presents for exploration of fusion removal of hardware and redo posterior lateral fusion L5-S1  [name], MD 01/17/2016, 11:19 AM

## 2016-01-17 NOTE — Anesthesia Procedure Notes (Signed)
Procedure Name: Intubation Date/Time: 01/17/2016 11:55 AM Performed by: Judeth Cornfield T Pre-anesthesia Checklist: Patient identified, Emergency Drugs available, Suction available and Patient being monitored Patient Re-evaluated:Patient Re-evaluated prior to inductionOxygen Delivery Method: Circle System Utilized Preoxygenation: Pre-oxygenation with 100% oxygen Intubation Type: IV induction Ventilation: Mask ventilation without difficulty Laryngoscope Size: Mac and 4 Grade View: Grade I Tube type: Oral Tube size: 7.5 mm Number of attempts: 1 Airway Equipment and Method: Stylet Placement Confirmation: ETT inserted through vocal cords under direct vision,  positive ETCO2 and breath sounds checked- equal and bilateral Secured at: 23 cm Tube secured with: Tape Dental Injury: Teeth and Oropharynx as per pre-operative assessment

## 2016-01-17 NOTE — Transfer of Care (Signed)
Immediate Anesthesia Transfer of Care Note  Patient: Brad [name]  Procedure(s) Performed: Procedure(s): Removal of hardware/  posterolateral arthrodesis lumbar five sacral one  (N/A)  Patient Location: PACU  Anesthesia Type:General  Level of Consciousness: awake, patient cooperative and responds to stimulation  Airway & Oxygen Therapy: Patient Spontanous Breathing and Patient connected to nasal cannula oxygen  Post-op Assessment: Report given to RN, Post -op Vital signs reviewed and stable and Patient moving all extremities X 4  Post vital signs: Reviewed and stable  Last Vitals:  Filed Vitals:   01/17/16 0929  BP: 116/71  Pulse: 79  Temp: 36.5 C  Resp: 20    Last Pain:  Filed Vitals:   01/17/16 0930  PainSc: 3          Complications: No apparent anesthesia complications

## 2016-01-17 NOTE — Op Note (Signed)
Preoperative diagnosis: Painful hardware and possible pseudoarthrosis L5-S1  Postoperative diagnosis: Same  Procedure: Reexploration of of fusion removal of hardware L4-S1 with redo posterior lateral fusion L5-S1 utilizing locally harvested autograft mixed with V toss and BMP  Surgeon: Dominica Severin Christionna Poland  Anesthesia: Gen.  EBL: Minimal  History of present illness: Patient is a 50 year old gentleman who's had progressive worsening back pain and a year and a half out from L4-S1 fusion workup showed progressive loosening of the S1 cortical screws however did show possible fusion versus pseudoarthrosis at L5-S1. Due to patient's progression of back pain failure conservative treatment imaging findings I recommended reexploration of fusion removal of hardware and assessment and possible redo posterior lateral fusion at L5-S1. I extensively reviewed the risks and benefits of the procedure with the patient as well as perioperative course expectations of outcome and alternatives of surgery and he understood and agreed to proceed forward.  Operative procedure: Patient brought into the or was induced under general anesthesia positioned prone the Wilson frame his back was prepped and draped in routine sterile fashion is old incision was opened up and the scar tissues dissected free down to the old hardware I dissected through all the hardware removed the nuts remove the rods and remove the screws. The S1 screws were definitely lose the L5 screws were also partially loose L4 was solid. The L4-5 fusion appear to be solid and looked like very little motion was having her L5-S1 although is more questionable that L4-5. I proceeded to expose the transverse process at L5 expose down to the TPN sacral a lot at S1 as well as lateral facet complexes. Then I bit away some of the lateral facet complex and morcellized that mixed it with bone shavings from decortication and after aggressive irrigation placed the bone shavings in the  morselized facet complex along with a BMP sponge and 2 10 mL V toss sponges posterior laterally from L5-S1. Then I placed a large Hemovac drain and closed the wound in layers with interrupted Vicryl skin was closed running 4 subcuticular Dermabond benzo and Steri-Strips and a sterile dressing was applied and patient went to recovery room in stable condition. At the end of case all needle counts sponge counts were correct.

## 2016-01-18 NOTE — Discharge Summary (Signed)
  Physician Discharge Summary  Patient ID: Brad [name] MRN: [id number] DOB/AGE: [birth year] 50 y.o.  Admit date: 01/17/2016 Discharge date: 01/18/2016  Admission Diagnoses:Pseudoarthrosis L5-S1 painful hardware  Discharge Diagnoses: Same Active Problems:   Pseudoarthrosis of lumbar spine   Discharged Condition: good  Hospital Course: Patient admitted hospital underwent exploration of fusion we will hardware and redo posterior lateral fusion postoperatively very well recovered in the floor and the floor was angling and voiding spontaneously tolerating her diet was stable for discharge home.  Consults: Significant Diagnostic Studies: Treatments: Removal of hardware redo posterior lateral fusion Discharge Exam: Blood pressure 114/72, pulse 79, temperature 97.6 F (36.4 C), temperature source Oral, resp. rate 20, height 6\' 3"  (1.905 m), weight 136.249 kg (300 lb 6 oz), SpO2 96 %. Strength out of 5 wound clean dry and intact  Disposition: Home     Medication List    TAKE these medications        amLODipine-benazepril 5-20 MG capsule  Commonly known as:  LOTREL  Take 1 capsule by mouth daily.     cetirizine 10 MG tablet  Commonly known as:  ZYRTEC  Take 10 mg by mouth daily.     cyclobenzaprine 10 MG tablet  Commonly known as:  FLEXERIL  Take 1 tablet (10 mg total) by mouth 3 (three) times daily as needed for muscle spasms.     DULoxetine 30 MG capsule  Commonly known as:  CYMBALTA  Take 30 mg by mouth daily.     multivitamin with minerals tablet  Take 1 tablet by mouth daily.     oxyCODONE 15 mg 12 hr tablet  Commonly known as:  OXYCONTIN  Take 15 mg by mouth every 6 (six) hours as needed.     SALONPAS ARTHRITIS PAIN RELIEF EX  Apply 1 patch topically at bedtime as needed.           Follow-up Information    Follow up with [facility], MD.   Specialty:  Neurosurgery   Contact information:   [address] [place]  [address] [phone]       Signed: Elaina Hoops 01/18/2016, 7:33 AM

## 2016-01-18 NOTE — Evaluation (Signed)
Occupational Therapy Evaluation Patient Details Name: Brad [name] MRN: [id number] DOB: 10-18-1965 Today's Date: 01/18/2016    History of Present Illness 50 y.o. s/p Reexploration of of fusion removal of hardware L4-S1 with redo posterior lateral fusion L5-S1. PMH includes chest pain, HTN, obesity, GERD, anxiety, back surgeries.    Clinical Impression   Pt s/p above. Education provided in session to pt and spouse. OT signing off.     Follow Up Recommendations  No OT follow up;Supervision - Intermittent    Equipment Recommendations  None recommended by OT    Recommendations for Other Services       Precautions / Restrictions Precautions Precautions: Back Precaution Booklet Issued: No Precaution Comments: reviewed back precautions Required Braces or Orthoses: Spinal Brace Spinal Brace: Lumbar corset;Applied in sitting position (adjusted in standing) Restrictions Weight Bearing Restrictions: No      Mobility Bed Mobility Overal bed mobility: Needs Assistance Bed Mobility: Rolling;Sidelying to Sit;Sit to Sidelying Rolling: Supervision (Mod I-supervision) Sidelying to sit: Modified independent (Device/Increase time)     Sit to sidelying: Modified independent (Device/Increase time) General bed mobility comments: cues not to arch.  Transfers Overall transfer level: Needs assistance   Transfers: Sit to/from Stand Sit to Stand: Supervision            Balance    Supervision given for ambulation.                                        ADL Overall ADL's : Needs assistance/impaired                 Upper Body Dressing : Set up;Supervision/safety;Sitting   Lower Body Dressing: Supervision/safety;Set up;Sit to/from stand   Toilet Transfer: Supervision/safety;Ambulation (sit to stand from bed)           Functional mobility during ADLs: Supervision/safety General ADL Comments: Discussed incorporating precuations into functional  activities. Educated on LB ADL technique and AE. Educated on tub transfer technique. Educated on safety such as safe footwear and recommended someone be with him for tub transfer.  Educated on what pt could use for toilet aid if needed. Discussed back brace information.     Vision     Perception     Praxis      Pertinent Vitals/Pain Pain Assessment: 0-10 Pain Score:  (6-7) Pain Location: back  Pain Descriptors / Indicators: Sharp;Constant Pain Intervention(s): Monitored during session     Hand Dominance  (uses both)   Extremity/Trunk Assessment Upper Extremity Assessment Upper Extremity Assessment: Overall WFL for tasks assessed   Lower Extremity Assessment Lower Extremity Assessment: Defer to PT evaluation   Cervical / Trunk Assessment Cervical / Trunk Assessment: Normal   Communication Communication Communication: No difficulties   Cognition Arousal/Alertness: Awake/alert Behavior During Therapy: WFL for tasks assessed/performed Overall Cognitive Status: Within Functional Limits for tasks assessed                     General Comments       Exercises       Shoulder Instructions      Home Living Family/patient expects to be discharged to:: Private residence Living Arrangements: Spouse/significant other Available Help at Discharge: Family;Available PRN/intermittently Type of Home: House Home Access: Stairs to enter [employer] of Steps: 6 Entrance Stairs-Rails: Right;Left;Can reach both Home Layout: Two level;Able to live on main level with bedroom/bathroom     Bathroom  Shower/Tub: Teacher, early years/pre:  (elevated toilet)     Home Equipment: Walker - 2 wheels;Cane - single point;Shower seat;Adaptive equipment Adaptive Equipment: Reacher;Other (Comment) (long brush)        Prior Functioning/Environment Level of Independence: Needs assistance    ADL's / Homemaking Assistance Needed: assist with yardwork and LB dressing          OT Diagnosis: Acute pain   OT Problem List:     OT Treatment/Interventions:      OT Goals(Current goals can be found in the care plan section) Acute Rehab OT Goals Patient Stated Goal: go home  OT Frequency:     Barriers to D/C:            Co-evaluation              End of Session Equipment Utilized During Treatment: Back brace;Other (comment) (AE) Nurse Communication: Other (comment) (let her know when I was done with pt)  Activity Tolerance: Patient tolerated treatment well Patient left: in bed;with family/visitor present   Time: 1000-1016 OT Time Calculation (min): 16 min Charges:  OT General Charges $OT Visit: 1 Procedure OT Evaluation $OT Eval Low Complexity: 1 Procedure G-CodesBenito Mccreedy OTR/L [id number] 01/18/2016, 11:07 AM

## 2016-01-18 NOTE — Discharge Instructions (Signed)

## 2016-01-18 NOTE — Evaluation (Addendum)
Physical Therapy Evaluation Patient Details Name: Brad [name] MRN: [id number] DOB: 07-19-1966 Today's Date: 01/18/2016   History of Present Illness  50 y.o. s/p Reexploration of of fusion removal of hardware L4-S1 with redo posterior lateral fusion L5-S1. PMH includes chest pain, HTN, obesity, GERD, anxiety, back surgeries.   Clinical Impression  Patient evaluated by Physical Therapy with no further acute PT needs identified. All education has been completed and the patient has no further questions. Wife present for all education and handout provided. Patient eager to get lumbar drain removed and discharge to home. See below for any follow-up Physical Therapy or equipment needs. PT is signing off. Thank you for this referral.     Follow Up Recommendations No PT follow up    Equipment Recommendations  None recommended by PT    Recommendations for Other Services OT consult     Precautions / Restrictions Precautions Precautions: Back Precaution Booklet Issued: No Precaution Comments: reviewed all precautions (pt unfamiliar with not arching), including visual demonstrations Required Braces or Orthoses: Spinal Brace Spinal Brace: Lumbar corset;Applied in sitting position (adjusted in standing) Restrictions Weight Bearing Restrictions: No      Mobility  Bed Mobility               General bed mobility comments: pt sitting EOB and did not want to lie down (breakfast arrived and to eat sitting up); pt able to correctly describe correct technique to use  Transfers                 General transfer comment: NT-RN in to remove pt's lumbar drain; pt verbalized no difficulty  Ambulation/Gait             General Gait Details: NT-RN in to remove pt's lumbar drain; pt verbalized no difficulty  Stairs Stairs:  (NT-RN in to remove pt's lumbar drain; pt verbalized no diffi)          Wheelchair Mobility    Modified Rankin (Stroke Patients Only)       Balance                                              Pertinent Vitals/Pain Pain Assessment: 0-10 Pain Score:  (6-7) Faces Pain Scale: Hurts even more Pain Location: back  Pain Descriptors / Indicators: Sharp;Constant Pain Intervention(s): Monitored during session    Home Living Family/patient expects to be discharged to:: Private residence Living Arrangements: Spouse/significant other Available Help at Discharge: Family;Available PRN/intermittently Type of Home: House Home Access: Stairs to enter Entrance Stairs-Rails: Right;Left;Can reach both Entrance Stairs-Number of Steps: 6 Home Layout: Two level;Able to live on main level with bedroom/bathroom Home Equipment: Gilford Rile - 2 wheels;Cane - single point;Shower seat      Prior Function Level of Independence: Needs assistance      ADL's / Homemaking Assistance Needed: assist with yardwork and LB dressing         Hand Dominance   Dominant Hand:  (uses both)    Extremity/Trunk Assessment   Upper Extremity Assessment: Overall WFL for tasks assessed           Lower Extremity Assessment: Defer to PT evaluation      Cervical / Trunk Assessment: Normal  Communication   Communication: No difficulties  Cognition Arousal/Alertness: Awake/alert Behavior During Therapy: WFL for tasks assessed/performed Overall Cognitive Status: Within Functional Limits for tasks assessed  General Comments General comments (skin integrity, edema, etc.): Wife present for all education. Patient reported multiple previous back surgeries and has been using many of these techniques already. Has never had OT and interested in increasing independence with ADLs    Exercises        Assessment/Plan    PT Assessment Patent does not need any further PT services  PT Diagnosis Acute pain;Difficulty walking   PT Problem List    PT Treatment Interventions     PT Goals (Current goals can be found in the Care  Plan section)      Frequency     Barriers to discharge        Co-evaluation               End of Session   Activity Tolerance: Patient limited by pain;Treatment limited secondary to medical complications (Comment) (RN removing drain while educating) Patient left: in bed;with call bell/phone within reach;with family/visitor present (sitting EOB for breakfast) Nurse Communication: Mobility status         Time: [id number] PT Time Calculation (min) (ACUTE ONLY): 10 min   Charges:   PT Evaluation $PT Eval Low Complexity: 1 Procedure     PT G Codes:        Hollis Tuller 01-30-2016, 11:05 AM  Pager [phone]

## 2016-01-18 NOTE — Progress Notes (Signed)
Patient ID: Brad [name], male   DOB: 1966-06-05, 50 y.o.   MRN: [id number] Doing great neurologic stable discharge home

## 2016-01-18 NOTE — Anesthesia Postprocedure Evaluation (Signed)
Anesthesia Post Note  Patient: Brad [name]  Procedure(s) Performed: Procedure(s) (LRB): Removal of hardware/  posterolateral arthrodesis lumbar five sacral one  (N/A)  Patient location during evaluation: PACU Anesthesia Type: General Level of consciousness: awake and alert Pain management: pain level controlled Vital Signs Assessment: post-procedure vital signs reviewed and stable Respiratory status: spontaneous breathing, nonlabored ventilation, respiratory function stable and patient connected to nasal cannula oxygen Cardiovascular status: blood pressure returned to baseline and stable Postop Assessment: no signs of nausea or vomiting Anesthetic complications: no    Last Vitals:  Filed Vitals:   01/18/16 0432 01/18/16 0819  BP: 114/72 136/80  Pulse: 79 88  Temp: 36.4 C 36.4 C  Resp: 20 18    Last Pain:  Filed Vitals:   01/18/16 0819  PainSc: 7                  Montez Hageman

## 2016-01-18 NOTE — Progress Notes (Signed)
Patient alert and oriented, mae's well, voiding adequate amount of urine, swallowing without difficulty, c/o mild pain. Patient discharged home with family. Script and discharged instructions given to patient. Patient and family stated understanding of d/c instructions given and has an appointment with MD.   

## 2016-01-20 LAB — TYPE AND SCREEN
ABO/RH(D): O NEG
Antibody Screen: POSITIVE
DAT, IgG: POSITIVE
DONOR AG TYPE: NEGATIVE
DONOR AG TYPE: NEGATIVE
UNIT DIVISION: 0
UNIT DIVISION: 0

## 2016-02-24 ENCOUNTER — Ambulatory Visit (HOSPITAL_COMMUNITY): Payer: BLUE CROSS/BLUE SHIELD | Attending: Neurosurgery | Admitting: Physical Therapy

## 2016-07-26 ENCOUNTER — Other Ambulatory Visit (HOSPITAL_COMMUNITY): Payer: Self-pay | Admitting: Neurosurgery

## 2016-07-26 DIAGNOSIS — M5136 Other intervertebral disc degeneration, lumbar region: Secondary | ICD-10-CM

## 2016-08-02 ENCOUNTER — Ambulatory Visit (HOSPITAL_COMMUNITY)
Admission: RE | Admit: 2016-08-02 | Discharge: 2016-08-02 | Disposition: A | Discharge status: Home / Self Care | Payer: BLUE CROSS/BLUE SHIELD | Source: Ambulatory Visit | Attending: Neurosurgery | Admitting: Neurosurgery

## 2016-08-02 DIAGNOSIS — M5137 Other intervertebral disc degeneration, lumbosacral region: Secondary | ICD-10-CM | POA: Diagnosis not present

## 2016-08-02 DIAGNOSIS — M5136 Other intervertebral disc degeneration, lumbar region: Secondary | ICD-10-CM

## 2016-12-11 ENCOUNTER — Other Ambulatory Visit: Payer: Self-pay | Admitting: Cardiology

## 2017-01-15 ENCOUNTER — Other Ambulatory Visit: Payer: Self-pay | Admitting: Neurosurgery

## 2017-01-15 DIAGNOSIS — S32009K Unspecified fracture of unspecified lumbar vertebra, subsequent encounter for fracture with nonunion: Secondary | ICD-10-CM

## 2017-01-22 ENCOUNTER — Ambulatory Visit
Admission: RE | Admit: 2017-01-22 | Discharge: 2017-01-22 | Disposition: A | Discharge status: Home / Self Care | Payer: Medicare Other | Source: Ambulatory Visit | Attending: Neurosurgery | Admitting: Neurosurgery

## 2017-01-22 DIAGNOSIS — S32009K Unspecified fracture of unspecified lumbar vertebra, subsequent encounter for fracture with nonunion: Secondary | ICD-10-CM

## 2017-01-22 DIAGNOSIS — Z6839 Body mass index (BMI) 39.0-39.9, adult: Secondary | ICD-10-CM | POA: Diagnosis not present

## 2017-01-22 DIAGNOSIS — M960 Pseudarthrosis after fusion or arthrodesis: Secondary | ICD-10-CM | POA: Diagnosis not present

## 2017-01-22 DIAGNOSIS — M48062 Spinal stenosis, lumbar region with neurogenic claudication: Secondary | ICD-10-CM | POA: Diagnosis not present

## 2017-03-15 DIAGNOSIS — Z6838 Body mass index (BMI) 38.0-38.9, adult: Secondary | ICD-10-CM | POA: Diagnosis not present

## 2017-03-15 DIAGNOSIS — M48062 Spinal stenosis, lumbar region with neurogenic claudication: Secondary | ICD-10-CM | POA: Diagnosis not present

## 2017-03-15 DIAGNOSIS — I10 Essential (primary) hypertension: Secondary | ICD-10-CM | POA: Diagnosis not present

## 2017-03-15 DIAGNOSIS — S32009K Unspecified fracture of unspecified lumbar vertebra, subsequent encounter for fracture with nonunion: Secondary | ICD-10-CM | POA: Diagnosis not present

## 2017-06-12 DIAGNOSIS — Z6839 Body mass index (BMI) 39.0-39.9, adult: Secondary | ICD-10-CM | POA: Diagnosis not present

## 2017-06-12 DIAGNOSIS — I10 Essential (primary) hypertension: Secondary | ICD-10-CM | POA: Diagnosis not present

## 2017-06-12 DIAGNOSIS — S32009K Unspecified fracture of unspecified lumbar vertebra, subsequent encounter for fracture with nonunion: Secondary | ICD-10-CM | POA: Diagnosis not present

## 2017-06-12 DIAGNOSIS — M48061 Spinal stenosis, lumbar region without neurogenic claudication: Secondary | ICD-10-CM | POA: Diagnosis not present

## 2017-06-12 DIAGNOSIS — M5137 Other intervertebral disc degeneration, lumbosacral region: Secondary | ICD-10-CM | POA: Diagnosis not present

## 2017-06-12 DIAGNOSIS — M48062 Spinal stenosis, lumbar region with neurogenic claudication: Secondary | ICD-10-CM | POA: Diagnosis not present

## 2017-09-06 DIAGNOSIS — M48062 Spinal stenosis, lumbar region with neurogenic claudication: Secondary | ICD-10-CM | POA: Diagnosis not present

## 2017-09-06 DIAGNOSIS — I10 Essential (primary) hypertension: Secondary | ICD-10-CM | POA: Diagnosis not present

## 2017-09-06 DIAGNOSIS — Z6839 Body mass index (BMI) 39.0-39.9, adult: Secondary | ICD-10-CM | POA: Diagnosis not present

## 2017-09-06 DIAGNOSIS — M48061 Spinal stenosis, lumbar region without neurogenic claudication: Secondary | ICD-10-CM | POA: Diagnosis not present

## 2017-11-20 ENCOUNTER — Other Ambulatory Visit: Payer: Self-pay | Admitting: Cardiology

## 2017-11-20 MED ORDER — AMLODIPINE BESY-BENAZEPRIL HCL 5-20 MG PO CAPS: 1.0000 | ORAL_CAPSULE | Freq: Every day | ORAL | 0 refills | Status: DC

## 2017-11-20 NOTE — Telephone Encounter (Signed)
° °  1. Which medications need to be refilled? (please list name of each medication and dose if known)    amLODipine-benazepril (LOTREL) 5-20 MG capsule  2. Which pharmacy/location (including street and city if local pharmacy) is medication to be sent to?  [facility]  [place], [place]   3. Do they need a 30 day or 90 day supply?   Patient has not been seen in 2 years.

## 2017-11-20 NOTE — Telephone Encounter (Signed)
Rx sent. Appt scheduled

## 2017-12-26 ENCOUNTER — Encounter: Payer: Self-pay | Admitting: *Deleted

## 2017-12-27 ENCOUNTER — Encounter: Payer: Self-pay | Admitting: Cardiology

## 2017-12-27 ENCOUNTER — Ambulatory Visit (INDEPENDENT_AMBULATORY_CARE_PROVIDER_SITE_OTHER): Payer: Medicare Other | Admitting: Cardiology

## 2017-12-27 VITALS — BP 141/91 | HR 84 | Ht 75.0 in | Wt 302.0 lb

## 2017-12-27 DIAGNOSIS — G4730 Sleep apnea, unspecified: Secondary | ICD-10-CM

## 2017-12-27 DIAGNOSIS — I10 Essential (primary) hypertension: Secondary | ICD-10-CM

## 2017-12-27 DIAGNOSIS — R0789 Other chest pain: Secondary | ICD-10-CM | POA: Diagnosis not present

## 2017-12-27 DIAGNOSIS — E782 Mixed hyperlipidemia: Secondary | ICD-10-CM

## 2017-12-27 NOTE — Patient Instructions (Addendum)
Medication Instructions:  Your physician recommends that you continue on your current medications as directed. Please refer to the Current Medication list given to you today.  Labwork: CMET,CBC,HGBA1C,TSH,LIPIDS,TSH Orders given today   Testing/Procedures: NONE  Follow-Up: Your physician wants you to follow-up in: [place] [address] [facility]. You will receive a reminder letter in the mail two months in advance. If you don't receive a letter, please call our office to schedule the follow-up appointment.  Any Other Special Instructions Will Be Listed Below (If Applicable).  If you need a refill on your cardiac medications before your next appointment, please call your pharmacy.  DASH Eating Plan DASH stands for "Dietary Approaches to Stop Hypertension." The DASH eating plan is a healthy eating plan that has been shown to reduce high blood pressure (hypertension). It may also reduce your risk for type 2 diabetes, heart disease, and stroke. The DASH eating plan may also help with weight loss. What are tips for following this plan? General guidelines  Avoid eating more than 2,300 mg (milligrams) of salt (sodium) a day. If you have hypertension, you may need to reduce your sodium intake to 1,500 mg a day.  Limit alcohol intake to no more than 1 drink a day for nonpregnant women and 2 drinks a day for men. One drink equals 12 oz of beer, 5 oz of wine, or 1 oz of hard liquor.  Work with your health care provider to maintain a healthy body weight or to lose weight. Ask what an ideal weight is for you.  Get at least 30 minutes of exercise that causes your heart to beat faster (aerobic exercise) most days of the week. Activities may include walking, swimming, or biking.  Work with your health care provider or diet and nutrition specialist (dietitian) to adjust your eating plan to your individual calorie needs. Reading food labels  Check food labels for the amount of sodium per serving. Choose foods  with less than 5 percent of the Daily Value of sodium. Generally, foods with less than 300 mg of sodium per serving fit into this eating plan.  To find whole grains, look for the word "whole" as the first word in the ingredient list. Shopping  Buy products labeled as "low-sodium" or "no salt added."  Buy fresh foods. Avoid canned foods and premade or frozen meals. Cooking  Avoid adding salt when cooking. Use salt-free seasonings or herbs instead of table salt or sea salt. Check with your health care provider or pharmacist before using salt substitutes.  Do not fry foods. Cook foods using healthy methods such as baking, boiling, grilling, and broiling instead.  Cook with heart-healthy oils, such as olive, canola, soybean, or sunflower oil. Meal planning   Eat a balanced diet that includes: ? 5 or more servings of fruits and vegetables each day. At each meal, try to fill half of your plate with fruits and vegetables. ? Up to 6-8 servings of whole grains each day. ? Less than 6 oz of lean meat, poultry, or fish each day. A 3-oz serving of meat is about the same size as a deck of cards. One egg equals 1 oz. ? 2 servings of low-fat dairy each day. ? A serving of nuts, seeds, or beans 5 times each week. ? Heart-healthy fats. Healthy fats called Omega-3 fatty acids are found in foods such as flaxseeds and coldwater fish, like sardines, salmon, and mackerel.  Limit how much you eat of the following: ? Canned or prepackaged foods. ?  Food that is high in trans fat, such as fried foods. ? Food that is high in saturated fat, such as fatty meat. ? Sweets, desserts, sugary drinks, and other foods with added sugar. ? Full-fat dairy products.  Do not salt foods before eating.  Try to eat at least 2 vegetarian meals each week.  Eat more home-cooked food and less restaurant, buffet, and fast food.  When eating at a restaurant, ask that your food be prepared with less salt or no salt, if  possible. What foods are recommended? The items listed may not be a complete list. Talk with your dietitian about what dietary choices are best for you. Grains Whole-grain or whole-wheat bread. Whole-grain or whole-wheat pasta. Brown rice. [place]row. Bulgur. Whole-grain and low-sodium cereals. Pita bread. Low-fat, low-sodium crackers. Whole-wheat flour tortillas. Vegetables Fresh or frozen vegetables (raw, steamed, roasted, or grilled). Low-sodium or reduced-sodium tomato and vegetable juice. Low-sodium or reduced-sodium tomato sauce and tomato paste. Low-sodium or reduced-sodium canned vegetables. Fruits All fresh, dried, or frozen fruit. Canned fruit in natural juice (without added sugar). Meat and other protein foods Skinless chicken or [place]. Ground chicken or [place]. Pork with fat trimmed off. Fish and seafood. Egg whites. Dried beans, peas, or lentils. Unsalted nuts, nut butters, and seeds. Unsalted canned beans. Lean cuts of beef with fat trimmed off. Low-sodium, lean deli meat. Dairy Low-fat (1%) or fat-free (skim) milk. Fat-free, low-fat, or reduced-fat cheeses. Nonfat, low-sodium ricotta or cottage cheese. Low-fat or nonfat yogurt. Low-fat, low-sodium cheese. Fats and oils Soft margarine without trans fats. Vegetable oil. Low-fat, reduced-fat, or light mayonnaise and salad dressings (reduced-sodium). Canola, safflower, olive, soybean, and sunflower oils. Avocado. Seasoning and other foods Herbs. Spices. Seasoning mixes without salt. Unsalted popcorn and pretzels. Fat-free sweets. What foods are not recommended? The items listed may not be a complete list. Talk with your dietitian about what dietary choices are best for you. Grains Baked goods made with fat, such as croissants, muffins, or some breads. Dry pasta or rice meal packs. Vegetables Creamed or fried vegetables. Vegetables in a cheese sauce. Regular canned vegetables (not low-sodium or reduced-sodium). Regular canned  tomato sauce and paste (not low-sodium or reduced-sodium). Regular tomato and vegetable juice (not low-sodium or reduced-sodium). Angie Fava. Olives. Fruits Canned fruit in a light or heavy syrup. Fried fruit. Fruit in cream or butter sauce. Meat and other protein foods Fatty cuts of meat. Ribs. Fried meat. Berniece Salines. Sausage. Bologna and other processed lunch meats. Salami. Fatback. Hotdogs. Bratwurst. Salted nuts and seeds. Canned beans with added salt. Canned or smoked fish. Whole eggs or egg yolks. Chicken or [place] with skin. Dairy Whole or 2% milk, cream, and half-and-half. Whole or full-fat cream cheese. Whole-fat or sweetened yogurt. Full-fat cheese. Nondairy creamers. Whipped toppings. Processed cheese and cheese spreads. Fats and oils Butter. Stick margarine. Lard. Shortening. Ghee. Bacon fat. Tropical oils, such as coconut, palm kernel, or palm oil. Seasoning and other foods Salted popcorn and pretzels. Onion salt, garlic salt, seasoned salt, table salt, and sea salt. Worcestershire sauce. Tartar sauce. Barbecue sauce. Teriyaki sauce. Soy sauce, including reduced-sodium. Steak sauce. Canned and packaged gravies. Fish sauce. Oyster sauce. Cocktail sauce. Horseradish that you find on the shelf. Ketchup. Mustard. Meat flavorings and tenderizers. Bouillon cubes. Hot sauce and Tabasco sauce. Premade or packaged marinades. Premade or packaged taco seasonings. Relishes. Regular salad dressings. Where to find more information:  National Heart, Lung, and [place]: https://wilson-eaton.com/  American Heart Association: www.heart.org Summary  The DASH eating plan is  a healthy eating plan that has been shown to reduce high blood pressure (hypertension). It may also reduce your risk for type 2 diabetes, heart disease, and stroke.  With the DASH eating plan, you should limit salt (sodium) intake to 2,300 mg a day. If you have hypertension, you may need to reduce your sodium intake to 1,500 mg a day.  When  on the DASH eating plan, aim to eat more fresh fruits and vegetables, whole grains, lean proteins, low-fat dairy, and heart-healthy fats.  Work with your health care provider or diet and nutrition specialist (dietitian) to adjust your eating plan to your individual calorie needs. This information is not intended to replace advice given to you by your health care provider. Make sure you discuss any questions you have with your health care provider. Document Released: 08/24/2011 Document Revised: 08/28/2016 Document Reviewed: 08/28/2016 Elsevier Interactive Patient Education  [employer].

## 2017-12-27 NOTE — Progress Notes (Signed)
Clinical Summary Mr. Brad [name] is a 52 y.o.male seen today for follow up of the following medical problems.   1. Atypical chest pain - prior evaluation in 2012 w/ chest pain, lexiscan had possible defect, referred for cath. Jan 2012 cath had normal coronaries - 05/2013 GXT without ischemia .   - no recent chest pain   2. HTN  - home bp's 130s/90s - compliant with meds, has not taken yet today however   3. OSA screen - + snoring, no apneic episodes, can have some daytime somnolence.    Past Medical History:  Diagnosis Date  . Anxiety   . Chest pain, unspecified   . GERD (gastroesophageal reflux disease)   . Obesity, unspecified   . Other abnormal blood chemistry   . Other chronic nonalcoholic liver disease   . Unspecified essential hypertension      No Known Allergies   Current Outpatient Medications  Medication Sig Dispense Refill  . amLODipine-benazepril (LOTREL) 5-20 MG capsule Take 1 capsule by mouth daily. 90 capsule 0  . cetirizine (ZYRTEC) 10 MG tablet Take 10 mg by mouth daily.    . cyclobenzaprine (FLEXERIL) 10 MG tablet Take 1 tablet (10 mg total) by mouth 3 (three) times daily as needed for muscle spasms. 80 tablet 0  . DULoxetine (CYMBALTA) 30 MG capsule Take 30 mg by mouth daily.    . Liniments (SALONPAS ARTHRITIS PAIN RELIEF EX) Apply 1 patch topically at bedtime as needed.    . Multiple Vitamins-Minerals (MULTIVITAMIN WITH MINERALS) tablet Take 1 tablet by mouth daily.    . OxyCODONE (OXYCONTIN) 15 mg T12A 12 hr tablet Take 15 mg by mouth every 6 (six) hours as needed.      No current facility-administered medications for this visit.      Past Surgical History:  Procedure Laterality Date  . BACK SURGERY     x3  . CHOLECYSTECTOMY    . HERNIA REPAIR     CHILD  . LUMBAR LAMINECTOMY/DECOMPRESSION MICRODISCECTOMY  11/29/2011   Procedure: LUMBAR LAMINECTOMY/DECOMPRESSION MICRODISCECTOMY;  Surgeon: Elaina Hoops, MD;  Location: [facility];   Service: Neurosurgery;  Laterality: N/A;  Lumbar Laminectomy/Discectomy Decompression Thoracic Tweleve-Lumbar One, Lumbar Four-Five     No Known Allergies    Family History  Problem Relation Age of Onset  . Hypertension Mother   . Coronary artery disease Unknown        FATHER     Social History Mr. Cancio reports that he has never smoked. He has never used smokeless tobacco. Mr. Kant reports that he does not drink alcohol.   Review of Systems CONSTITUTIONAL: No weight loss, fever, chills, weakness or fatigue.  HEENT: Eyes: No visual loss, blurred vision, double vision or yellow sclerae.No hearing loss, sneezing, congestion, runny nose or sore throat.  SKIN: No rash or itching.  CARDIOVASCULAR: per hpi RESPIRATORY: No shortness of breath, cough or sputum.  GASTROINTESTINAL: No anorexia, nausea, vomiting or diarrhea. No abdominal pain or blood.  GENITOURINARY: No burning on urination, no polyuria NEUROLOGICAL: No headache, dizziness, syncope, paralysis, ataxia, numbness or tingling in the extremities. No change in bowel or bladder control.  MUSCULOSKELETAL: No muscle, back pain, joint pain or stiffness.  LYMPHATICS: No enlarged nodes. No history of splenectomy.  PSYCHIATRIC: No history of depression or anxiety.  ENDOCRINOLOGIC: No reports of sweating, cold or heat intolerance. No polyuria or polydipsia.  Marland Kitchen   Physical Examination Vitals:   12/27/17 0825  BP: (!) 141/91  Pulse: 84  SpO2: 97%   Vitals:   12/27/17 0825  Weight: (!) 302 lb (137 kg)  Height: 6\' 3"  (1.905 m)    Gen: resting comfortably, no acute distress HEENT: no scleral icterus, pupils equal round and reactive, no palptable cervical adenopathy,  CV: RRR, no m/r/g, no vjd Resp: Clear to auscultation bilaterally GI: abdomen is soft, non-tender, non-distended, normal bowel sounds, no hepatosplenomegaly MSK: extremities are warm, no edema.  Skin: warm, no rash Neuro:  no focal deficits Psych:  appropriate affect   Diagnostic Studies 06/02/13 Stress EKG [place]: excercised 8 min(test stopped b/c at [facility], no b/c max functional capacity), no ischemic changes, Duke treadmill score 8 low risk     Assessment and Plan  1. Atypical chest pain - no recent symptoms, previous negative cardiac workup - continue to monitor at this time.  - EKG in clinic today SR, no ischemic changes.   2 HTN  - elevated in clinic, home bp's at goal - continue current meds - given info on DASH diet  3. OSA screen - signs and symptoms of OSA, refer to Dr Luan Pulling.   4. Severe obestity  - counseled on exercise and weight loss, dietary changes   Order annual labs. F/u 1 year.    F/u 1 year. He does not have pcp at this time, we will check annual labs.        Arnoldo Lenis, M.D.,

## 2018-01-01 ENCOUNTER — Encounter: Payer: Self-pay | Admitting: Cardiology

## 2018-01-17 ENCOUNTER — Telehealth: Payer: Self-pay | Admitting: *Deleted

## 2018-01-17 NOTE — Telephone Encounter (Signed)
Pt made aware and voiced understanding - Dr Roma Kayser number given - will mail new lab orders for lipid panel - routed to Dr Mannie Stabile as per pt request

## 2018-01-17 NOTE — Telephone Encounter (Signed)
-----   Message from Arnoldo Lenis, MD sent at 01/15/2018 12:44 PM EDT ----- Tests show he has severe diabetes. He needs to see a pcp ASAP, if he doesn't have one then give him Dr Roma Kayser number in [place]. Also his liver tests are mildly elevated, this is something else a pcp would need to address. Also they ran only a total cholesterol, he was to have a full lipid panel, can we call the lab.    Zandra Abts MD

## 2018-02-11 ENCOUNTER — Other Ambulatory Visit: Payer: Self-pay | Admitting: Cardiology

## 2018-02-25 IMAGING — CT CT L SPINE W/O CM
3 of 9 series · 12 of 33 positions shown, 14 images · non-contrast
Comparison: MRI 10/28/2015.  CT 04/09/2015.

CLINICAL DATA: Left-sided back pain radiating to the left leg. Some
right leg symptoms, not as severe. Previous fusion surgery.

EXAM:
CT LUMBAR SPINE WITHOUT CONTRAST
TECHNIQUE: Multidetector CT imaging of the lumbar spine was performed without
intravenous contrast administration. Multiplanar CT image
reconstructions were also generated.

[Series 3: lumbar spine 2.0 b30s · axial · 0.36mm/px · z∈[+798,+966]mm · 4 of 118 slices shown, 5 images]
[im 17/118  soft-tissue]
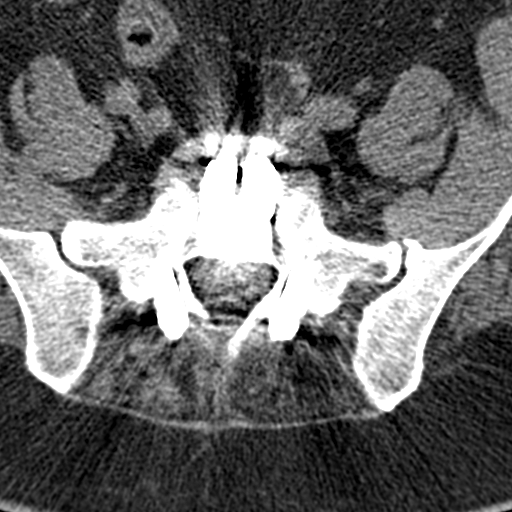
[im 17/118  bone]
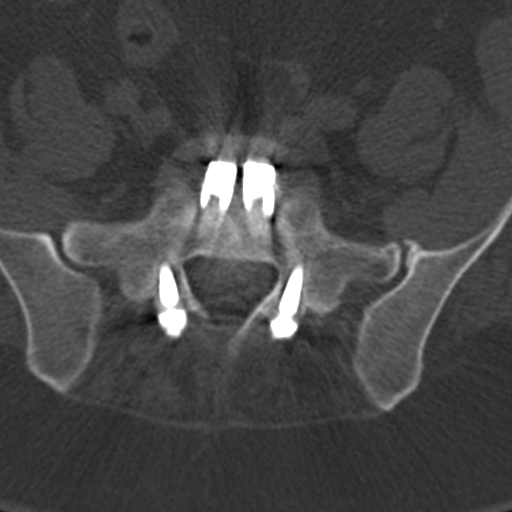
[im 51/118  bone]
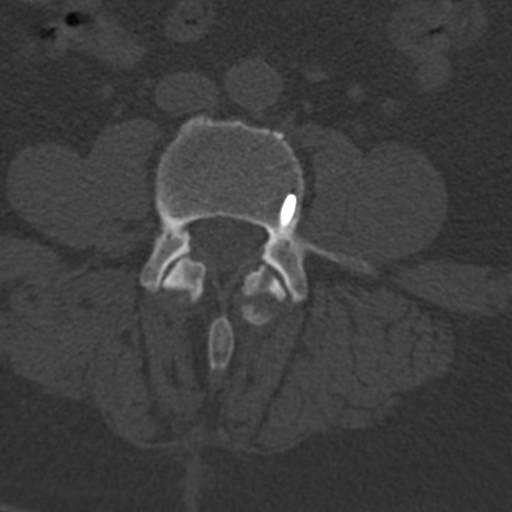
[im 67/118  bone]
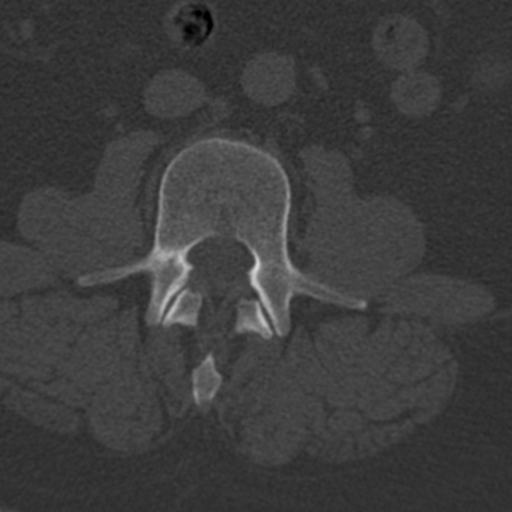
[im 101/118  bone]
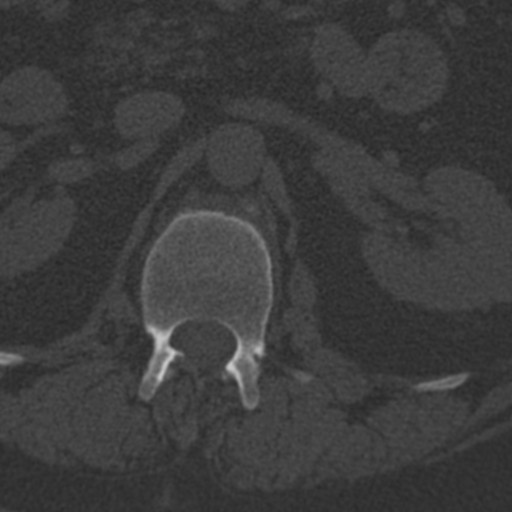

[Series 4: lumbar spine 2.0 spo cor · coronal · 0.36mm/px · 3 of 69 slices shown]
[im 14/69  bone]
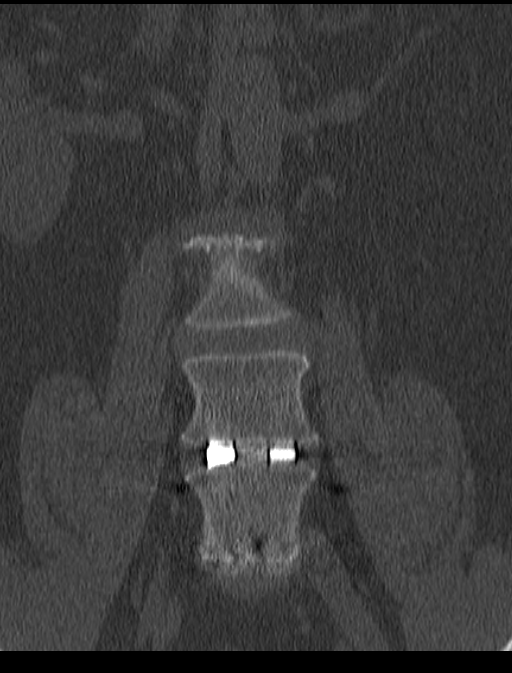
[im 28/69  bone]
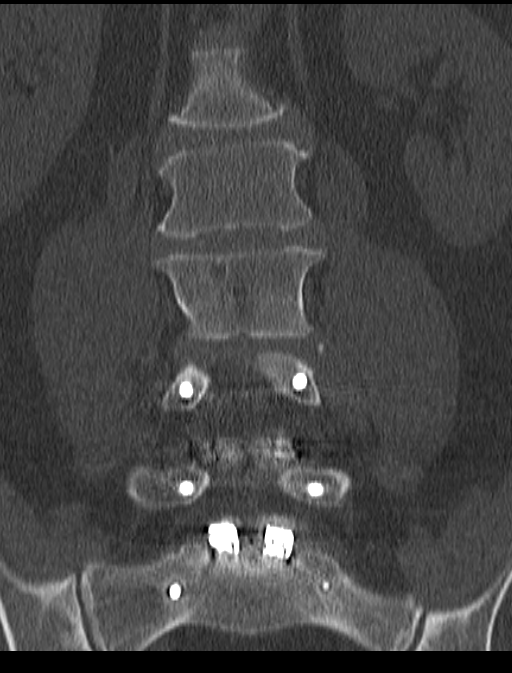
[im 41/69  bone]
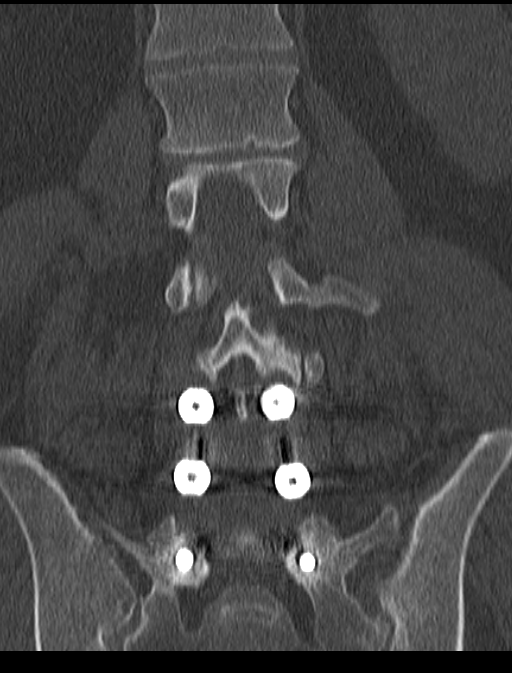

[Series 5: lumbar spine 2.0 spo sag · sagittal · 0.30mm/px · 5 of 82 slices shown, 6 images]
[im 28/82  bone]
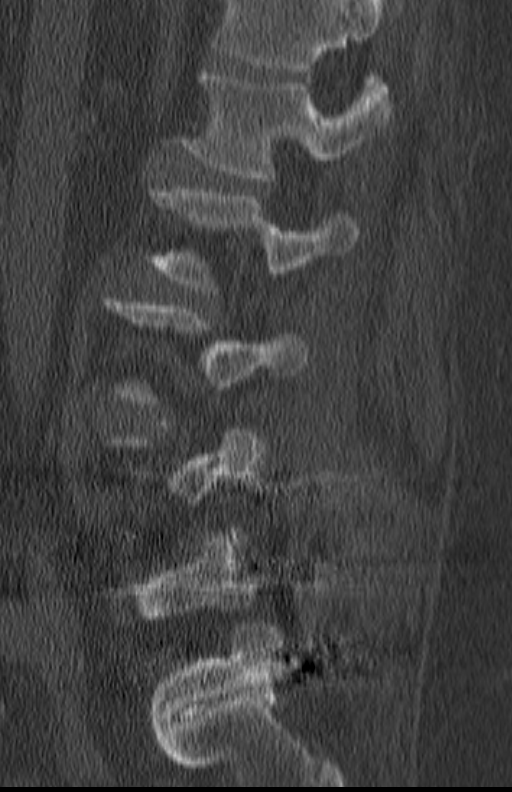
[im 34/82  bone]
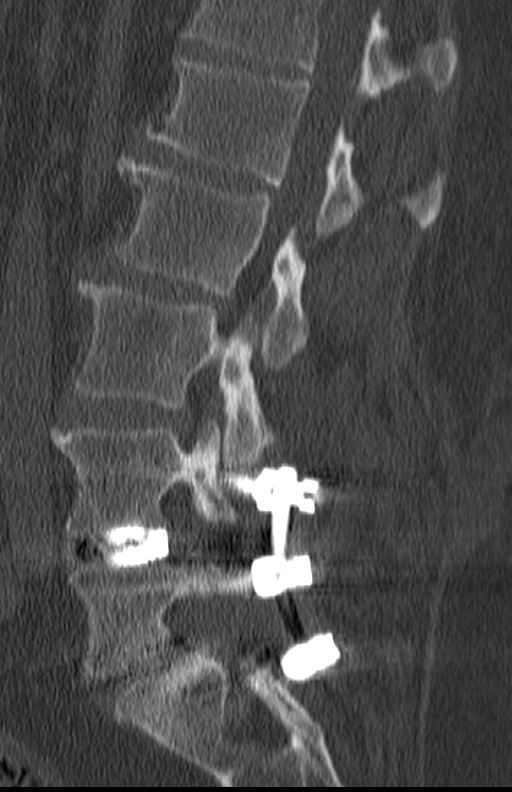
[im 41/82  soft-tissue]
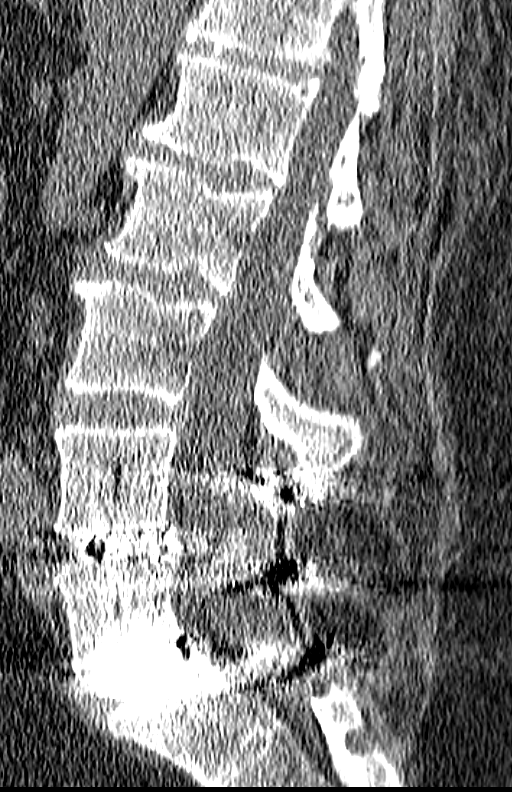
[im 41/82  bone]
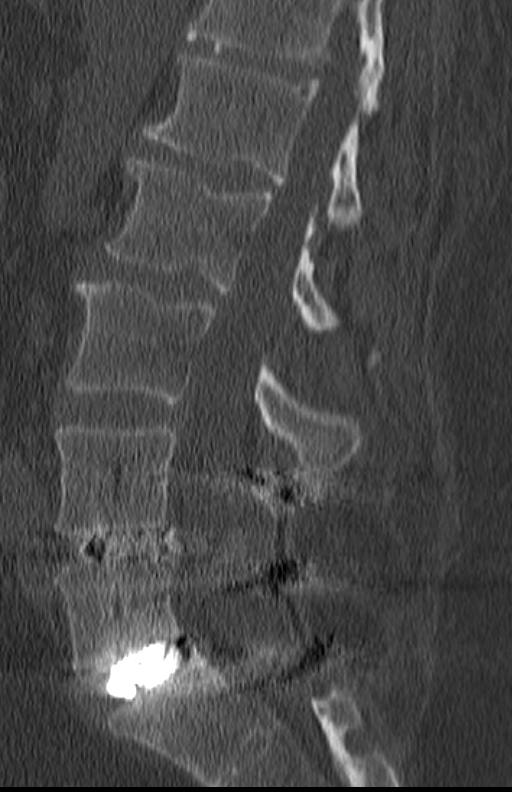
[im 48/82  bone]
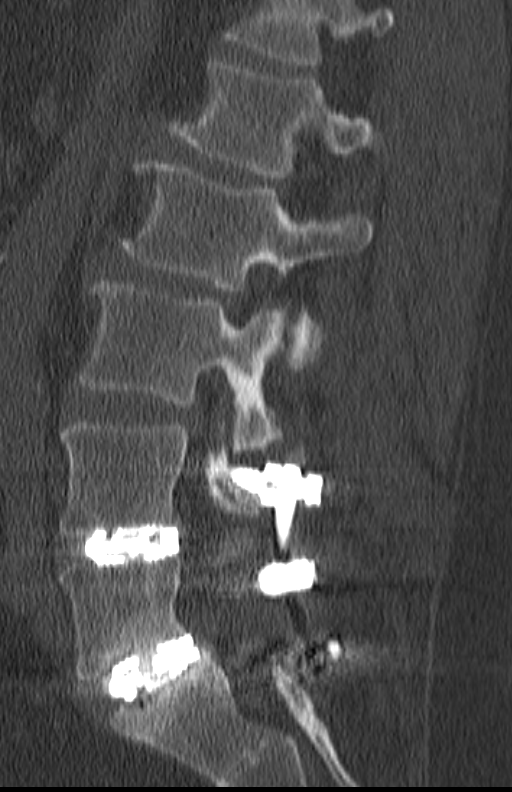
[im 55/82  bone]
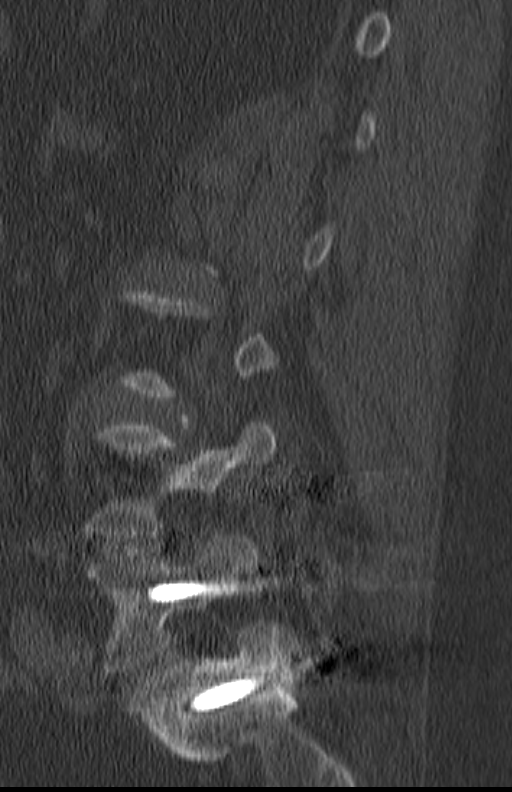

[12 of 33 positions shown; findings below may reference images not displayed]

FINDINGS: T12-L1: Mild bulging of the disc. No stenosis. Moderate facet
degeneration.

L1-2: Mild bulging of the disc. No stenosis. Mild facet
degeneration. Ligamentous calcification.

L2-3: Mild bulging of the disc. Mild facet and ligamentous
hypertrophy. No compressive stenosis.

L3-4: Mild bulging of the disc, slightly more towards the left. No
central canal stenosis. Mild foraminal to extra foraminal
encroachment but without visible neural compression.

L4-5: Previous posterior decompression, diskectomy and fusion.
Fusion is solid. Wide patency of the canal and foramina.

L5-S1: Previous posterior decompression, diskectomy and fusion
procedure. Interbody devices are satisfactorily positioned with
posterior margins lining up with the vertebral body margins. There
is continued suggestion of nonunion with some nitrogen gas adjacent
to the interbody cages and some continued lucency around the S1
screws. No stenosis.

Sacroiliac joints show minimal degenerative changes.
IMPRESSION: Continued suggestion of nonunion at the L5-S1 level, though the
appearance is not as dramatic as when compared to the CT scan of
04/09/2015. Nitrogen gas adjacent to the interbody cages. Persistent
lucency around the S1 screws.

## 2018-06-06 DIAGNOSIS — Z6834 Body mass index (BMI) 34.0-34.9, adult: Secondary | ICD-10-CM | POA: Diagnosis not present

## 2018-06-06 DIAGNOSIS — I10 Essential (primary) hypertension: Secondary | ICD-10-CM | POA: Diagnosis not present

## 2018-06-06 DIAGNOSIS — M48061 Spinal stenosis, lumbar region without neurogenic claudication: Secondary | ICD-10-CM | POA: Diagnosis not present

## 2018-06-06 DIAGNOSIS — M5137 Other intervertebral disc degeneration, lumbosacral region: Secondary | ICD-10-CM | POA: Diagnosis not present

## 2018-07-08 DIAGNOSIS — M5137 Other intervertebral disc degeneration, lumbosacral region: Secondary | ICD-10-CM | POA: Diagnosis not present

## 2019-11-25 ENCOUNTER — Other Ambulatory Visit: Payer: Self-pay | Admitting: Neurosurgery

## 2019-11-25 DIAGNOSIS — M5440 Lumbago with sciatica, unspecified side: Secondary | ICD-10-CM

## 2019-11-26 ENCOUNTER — Telehealth: Payer: Self-pay

## 2019-11-26 NOTE — Telephone Encounter (Signed)
Phone call to patient to verify medication list and allergies for myelogram procedure. Medications pt is currently taking are safe to continue to take. Advised pt if any new medications are started prior to procedure to call and make [place] aware. Pt also instructed to have a driver the day of the procedure. Pre and post instructions were reviewed including that the pt would need to lay flat for at least 24 hours after procedure. Pt verbalized understanding.

## 2019-12-02 ENCOUNTER — Other Ambulatory Visit: Payer: BC Managed Care – PPO

## 2019-12-08 ENCOUNTER — Other Ambulatory Visit: Payer: Self-pay

## 2019-12-08 ENCOUNTER — Ambulatory Visit
Admission: RE | Admit: 2019-12-08 | Discharge: 2019-12-08 | Disposition: A | Discharge status: Home / Self Care | Payer: Medicare Other | Source: Ambulatory Visit | Attending: Neurosurgery | Admitting: Neurosurgery

## 2019-12-08 DIAGNOSIS — M5440 Lumbago with sciatica, unspecified side: Secondary | ICD-10-CM

## 2019-12-08 MED ORDER — DIAZEPAM 5 MG PO TABS
10.0000 mg | ORAL_TABLET | Freq: Once | ORAL | Status: AC
  Administered 2019-12-08: 09:00:00 10 mg via ORAL

## 2019-12-08 MED ORDER — IOPAMIDOL (ISOVUE-M 200) INJECTION 41%
18.0000 mL | Freq: Once | INTRAMUSCULAR | Status: AC
  Administered 2019-12-08: 18 mL via INTRATHECAL

## 2019-12-08 NOTE — Discharge Instructions (Signed)

## 2019-12-25 ENCOUNTER — Other Ambulatory Visit: Payer: Self-pay | Admitting: Cardiology

## 2019-12-25 MED ORDER — AMLODIPINE BESY-BENAZEPRIL HCL 5-20 MG PO CAPS: ORAL_CAPSULE | ORAL | 0 refills | Status: AC

## 2019-12-25 NOTE — Telephone Encounter (Signed)
Medication sent to pharmacy  

## 2019-12-25 NOTE — Telephone Encounter (Signed)
     1. Which medications need to be refilled? (please list name of each medication and dose if known)  amLODipine-benazepril    2. Which pharmacy/location (including street and city if local pharmacy) is medication to be sent to?  [facility] [place], [place]   3. Do they need a 30 day or 90 day supply?

## 2019-12-28 NOTE — Progress Notes (Deleted)
Cardiology Office Note  Date: 12/28/2019   ID: FRIEND VANDERPOEL, DOB [birth year], MRN [id number]  PCP:  Patient, No Pcp Per  Cardiologist:  Carlyle Dolly, MD Electrophysiologist:  None   Chief Complaint: Follow-up atypical chest pain, HTN, OSA screen,  History of Present Illness: JOSEL SHANK is a 54 y.o. male with a history of atypical chest pain, hypertension, suspected OSA, severe obesity  Last seen by Dr. Harl Bowie on December 27, 2017.  At that time he had experienced no recent chest pain.  Blood pressure 130s over 90s.  He recommended patient follow-up with PCP to referral for possible sleep study due to positive snoring and daytime somnolence.  He was referred to Dr. Luan Pulling for evaluation for obstructive sleep apnea.  He was counseled on exercise and weight loss as well as dietary changes.  He was given info on current  DASH diet.  At that time he had no PCP.  Annual labs were ordered.  Past Medical History:  Diagnosis Date  . Anxiety   . Chest pain, unspecified   . GERD (gastroesophageal reflux disease)   . Obesity, unspecified   . Other abnormal blood chemistry   . Other chronic nonalcoholic liver disease   . Unspecified essential hypertension     Past Surgical History:  Procedure Laterality Date  . BACK SURGERY     x3  . CHOLECYSTECTOMY    . HERNIA REPAIR     CHILD  . LUMBAR LAMINECTOMY/DECOMPRESSION MICRODISCECTOMY  11/29/2011   Procedure: LUMBAR LAMINECTOMY/DECOMPRESSION MICRODISCECTOMY;  Surgeon: Elaina Hoops, MD;  Location: [facility];  Service: Neurosurgery;  Laterality: N/A;  Lumbar Laminectomy/Discectomy Decompression Thoracic Tweleve-Lumbar One, Lumbar Four-Five    Current Outpatient Medications  Medication Sig Dispense Refill  . amLODipine-benazepril (LOTREL) 5-20 MG capsule TAKE 1 CAPSULE BY MOUTH EVERY DAY 90 capsule 0  . cetirizine (ZYRTEC) 10 MG tablet Take 10 mg by mouth daily.    . cyclobenzaprine (FLEXERIL) 10 MG tablet Take 1 tablet (10 mg  total) by mouth 3 (three) times daily as needed for muscle spasms. 80 tablet 0  . metFORMIN (GLUCOPHAGE) 1000 MG tablet Take 1,000 mg by mouth 2 (two) times daily with a meal.    . Multiple Vitamins-Minerals (MULTIVITAMIN WITH MINERALS) tablet Take 1 tablet by mouth daily.    . nortriptyline (PAMELOR) 50 MG capsule Take 50 mg by mouth 2 (two) times daily.     . OxyCODONE (OXYCONTIN) 15 mg T12A 12 hr tablet Take 15 mg by mouth every 6 (six) hours as needed.      No current facility-administered medications for this visit.   Allergies:  Patient has no known allergies.   Social History: The patient  reports that he has never smoked. He has never used smokeless tobacco. He reports that he does not drink alcohol or use drugs.   Family History: The patient's family history includes Coronary artery disease in his unknown relative; Hypertension in his mother.   ROS:  Please see the history of present illness. Otherwise, complete review of systems is positive for {NONE DEFAULTED:18576::"none"}.  All other systems are reviewed and negative.   Physical Exam: VS:  There were no vitals taken for this visit., BMI There is no height or weight on file to calculate BMI.  Wt Readings from Last 3 Encounters:  12/27/17 (!) 302 lb (137 kg)  01/17/16 (!) 300 lb 6 oz (136.2 kg)  01/10/16 (!) 300 lb 6.4 oz (136.3 kg)    General: Patient  appears comfortable at rest. HEENT: Conjunctiva and lids normal, oropharynx clear with moist mucosa. Neck: Supple, no elevated JVP or carotid bruits, no thyromegaly. Lungs: Clear to auscultation, nonlabored breathing at rest. Cardiac: Regular rate and rhythm, no S3 or significant systolic murmur, no pericardial rub. Abdomen: Soft, nontender, no hepatomegaly, bowel sounds present, no guarding or rebound. Extremities: No pitting edema, distal pulses 2+. Skin: Warm and dry. Musculoskeletal: No kyphosis. Neuropsychiatric: Alert and oriented x3, affect grossly  appropriate.  ECG:  {EKG/Telemetry Strips Reviewed:[phone]}  Recent Labwork: No results found for requested labs within last 8760 hours.  No results found for: CHOL, TRIG, HDL, CHOLHDL, VLDL, LDLCALC, LDLDIRECT  Other Studies Reviewed Today:   Dobutamine echocardiogram March 2012: No diagnostic ST segment abnormalities, no inducible wall motion abnormalities to indicate ischemia.  Assessment and Plan:  No diagnosis found.   Medication Adjustments/Labs and Tests Ordered: Current medicines are reviewed at length with the patient today.  Concerns regarding medicines are outlined above.   Disposition: Follow-up with ***  Signed, Levell July, NP 12/28/2019 5:43 PM    [place] [facility] [address], [place], [place] [address] Phone: [phone]; Fax: [phone]

## 2019-12-29 ENCOUNTER — Ambulatory Visit: Payer: Medicare Other | Admitting: Family Medicine

## 2020-07-29 ENCOUNTER — Encounter: Payer: Self-pay | Admitting: Internal Medicine

## 2020-09-03 ENCOUNTER — Encounter: Payer: Self-pay | Admitting: *Deleted

## 2020-09-21 ENCOUNTER — Ambulatory Visit (AMBULATORY_SURGERY_CENTER): Payer: Self-pay | Admitting: *Deleted

## 2020-09-21 ENCOUNTER — Other Ambulatory Visit: Payer: Self-pay

## 2020-09-21 VITALS — Ht 75.0 in | Wt 278.0 lb

## 2020-09-21 DIAGNOSIS — R195 Other fecal abnormalities: Secondary | ICD-10-CM

## 2020-09-21 MED ORDER — SUTAB 1479-225-188 MG PO TABS: 24.0000 | ORAL_TABLET | ORAL | 0 refills | Status: DC

## 2020-09-21 NOTE — Progress Notes (Signed)
No egg or soy allergy known to patient  No issues with past sedation with any surgeries or procedures No intubation problems in the past  No FH of Malignant Hyperthermia No diet pills per patient No home 02 use per patient  No blood thinners per patient  Pt denies issues with constipation  No A fib or A flutter  EMMI video to pt or via MyChart  COVID 19 guidelines implemented in [place] today with Pt and RN  Pt is fully vaccinated  for Covid   [place]  Coupon given to pt in PV today , Code to Pharmacy   Due to the COVID-19 pandemic we are asking patients to follow certain guidelines.  Pt aware of COVID protocols and [facility] guidelines   

## 2020-09-27 ENCOUNTER — Encounter: Payer: Self-pay | Admitting: Internal Medicine

## 2020-10-05 ENCOUNTER — Encounter: Payer: Medicare Other | Admitting: Internal Medicine

## 2020-10-29 ENCOUNTER — Encounter: Payer: Self-pay | Admitting: Internal Medicine

## 2020-10-29 ENCOUNTER — Ambulatory Visit (AMBULATORY_SURGERY_CENTER): Payer: Medicare Other | Admitting: Internal Medicine

## 2020-10-29 ENCOUNTER — Other Ambulatory Visit: Payer: Self-pay

## 2020-10-29 VITALS — BP 145/82 | HR 82 | Temp 97.9°F | Resp 14 | Ht 75.0 in | Wt 278.0 lb

## 2020-10-29 DIAGNOSIS — K635 Polyp of colon: Secondary | ICD-10-CM

## 2020-10-29 DIAGNOSIS — D123 Benign neoplasm of transverse colon: Secondary | ICD-10-CM

## 2020-10-29 DIAGNOSIS — D125 Benign neoplasm of sigmoid colon: Secondary | ICD-10-CM | POA: Diagnosis not present

## 2020-10-29 DIAGNOSIS — K648 Other hemorrhoids: Secondary | ICD-10-CM | POA: Diagnosis not present

## 2020-10-29 DIAGNOSIS — R195 Other fecal abnormalities: Secondary | ICD-10-CM | POA: Diagnosis not present

## 2020-10-29 MED ORDER — SODIUM CHLORIDE 0.9 % IV SOLN: 500.0000 mL | Freq: Once | INTRAVENOUS | Status: DC

## 2020-10-29 NOTE — Progress Notes (Signed)
A/ox3, pleased with MAC, report to RN 

## 2020-10-29 NOTE — Patient Instructions (Signed)
Resume previous medications.  3 polyps removed and sent to pathology.  Small internal hemorrhoids.  Await pathology for final recommendations.  Handouts on findings given to patient.    YOU HAD AN ENDOSCOPIC PROCEDURE TODAY AT [facility]:   Refer to the procedure report that was given to you for any specific questions about what was found during the examination.  If the procedure report does not answer your questions, please call your gastroenterologist to clarify.  If you requested that your care partner not be given the details of your procedure findings, then the procedure report has been included in a sealed envelope for you to review at your convenience later.  YOU SHOULD EXPECT: Some feelings of bloating in the abdomen. Passage of more gas than usual.  Walking can help get rid of the air that was put into your GI tract during the procedure and reduce the bloating. If you had a lower endoscopy (such as a colonoscopy or flexible sigmoidoscopy) you may notice spotting of blood in your stool or on the toilet paper. If you underwent a bowel prep for your procedure, you may not have a normal bowel movement for a few days.  Please Note:  You might notice some irritation and congestion in your nose or some drainage.  This is from the oxygen used during your procedure.  There is no need for concern and it should clear up in a day or so.  SYMPTOMS TO REPORT IMMEDIATELY:   Following lower endoscopy (colonoscopy or flexible sigmoidoscopy):  Excessive amounts of blood in the stool  Significant tenderness or worsening of abdominal pains  Swelling of the abdomen that is new, acute  Fever of 100F or higher   For urgent or emergent issues, a gastroenterologist can be reached at any hour by calling [phone]. Do not use MyChart messaging for urgent concerns.    DIET:  We do recommend a small meal at first, but then you may proceed to your regular diet.  Drink plenty of fluids but  you should avoid alcoholic beverages for 24 hours.  ACTIVITY:  You should plan to take it easy for the rest of today and you should NOT DRIVE or use heavy machinery until tomorrow (because of the sedation medicines used during the test).    FOLLOW UP: Our staff will call the number listed on your records 48-72 hours following your procedure to check on you and address any questions or concerns that you may have regarding the information given to you following your procedure. If we do not reach you, we will leave a message.  We will attempt to reach you two times.  During this call, we will ask if you have developed any symptoms of COVID 19. If you develop any symptoms (ie: fever, flu-like symptoms, shortness of breath, cough etc.) before then, please call [phone].  If you test positive for Covid 19 in the 2 weeks post procedure, please call and report this information to [place].    If any biopsies were taken you will be contacted by phone or by letter within the next 1-3 weeks.  Please call us at [phone] if you have not heard about the biopsies in 3 weeks.    SIGNATURES/CONFIDENTIALITY: You and/or your care partner have signed paperwork which will be entered into your electronic medical record.  These signatures attest to the fact that that the information above on your After Visit Summary has been reviewed and is understood.  Full responsibility  of the confidentiality of this discharge information lies with you and/or your care-partner.

## 2020-10-29 NOTE — Progress Notes (Signed)
Called to room to assist during endoscopic procedure.  Patient ID and intended procedure confirmed with present staff. Received instructions for my participation in the procedure from the performing physician.  

## 2020-10-29 NOTE — Op Note (Signed)
[facility] Patient Name: Brad [name] Procedure Date: 10/29/2020 1:53 PM MRN: [id number] Endoscopist: Jerene Bears , MD Age: 55 Referring MD:  Date of Birth: [birth year] Gender: Male Account #: [id number] Procedure:                Colonoscopy Indications:              Positive Cologuard test Medicines:                Propofol per Anesthesia Procedure:                Pre-Anesthesia Assessment:                           - Prior to the procedure, a History and Physical                            was performed, and patient medications and                            allergies were reviewed. The patient's tolerance of                            previous anesthesia was also reviewed. The risks                            and benefits of the procedure and the sedation                            options and risks were discussed with the patient.                            All questions were answered, and informed consent                            was obtained. Prior Anticoagulants: The patient has                            taken no previous anticoagulant or antiplatelet                            agents. ASA Grade Assessment: III - A patient with                            severe systemic disease. After reviewing the risks                            and benefits, the patient was deemed in                            satisfactory condition to undergo the procedure.                           After obtaining informed consent, the colonoscope  was passed under direct vision. Throughout the                            procedure, the patient's blood pressure, pulse, and                            oxygen saturations were monitored continuously. The                            Olympus CF-HQ190 [id number]) Colonoscope was                            introduced through the anus and advanced to the                            cecum, identified by appendiceal orifice and                             ileocecal valve. The colonoscopy was performed                            without difficulty. The patient tolerated the                            procedure well. The quality of the bowel                            preparation was good. The ileocecal valve,                            appendiceal orifice, and rectum were photographed. Scope In: 2:10:11 PM Scope Out: 2:29:46 PM Scope Withdrawal Time: 0 hours 16 minutes 20 seconds  Total Procedure Duration: 0 hours 19 minutes 35 seconds  Findings:                 The digital rectal exam was normal.                           Two sessile polyps were found in the transverse                            colon. The polyps were 4 to 5 mm in size. These                            polyps were removed with a cold snare. Resection                            and retrieval were complete.                           A 4 mm polyp was found in the distal sigmoid colon.                            The polyp was sessile. The polyp  was removed with a                            cold snare. Resection and retrieval were complete.                           Internal hemorrhoids were found during                            retroflexion. The hemorrhoids were small. Complications:            No immediate complications. Estimated Blood Loss:     Estimated blood loss was minimal. Impression:               - Two 4 to 5 mm polyps in the transverse colon,                            removed with a cold snare. Resected and retrieved.                           - One 4 mm polyp in the distal sigmoid colon,                            removed with a cold snare. Resected and retrieved.                           - Small internal hemorrhoids. Recommendation:           - Patient has a contact number available for                            emergencies. The signs and symptoms of potential                            delayed complications were discussed with the                             patient. Return to normal activities tomorrow.                            Written discharge instructions were provided to the                            patient.                           - Resume previous diet.                           - Continue present medications.                           - Await pathology results.                           - Repeat colonoscopy is recommended. The  colonoscopy date will be determined after pathology                            results from today's exam become available for                            review. Jerene Bears, MD 10/29/2020 2:31:47 PM This report has been signed electronically.

## 2020-10-29 NOTE — Progress Notes (Signed)
Medical history reviewed with no changes noted. VS assessed by C.W 

## 2020-11-02 ENCOUNTER — Telehealth: Payer: Self-pay

## 2020-11-02 ENCOUNTER — Telehealth: Payer: Self-pay | Admitting: *Deleted

## 2020-11-02 NOTE — Telephone Encounter (Signed)
Called [phone] and left a message we tried to reach pt for a follow up call. maw

## 2020-11-02 NOTE — Telephone Encounter (Signed)
  Follow up Call-  Call back number 10/29/2020  Post procedure Call Back phone  # [phone]  Permission to leave phone message Yes  Some recent data might be hidden     Patient questions:  Message left to call us if necessary.

## 2020-11-04 ENCOUNTER — Encounter: Payer: Self-pay | Admitting: Internal Medicine

## 2021-05-31 ENCOUNTER — Other Ambulatory Visit: Payer: Self-pay | Admitting: Family Medicine

## 2021-05-31 ENCOUNTER — Other Ambulatory Visit (HOSPITAL_COMMUNITY): Payer: Self-pay | Admitting: Family Medicine

## 2021-05-31 DIAGNOSIS — R609 Edema, unspecified: Secondary | ICD-10-CM

## 2021-06-01 ENCOUNTER — Ambulatory Visit (HOSPITAL_COMMUNITY)
Admission: RE | Admit: 2021-06-01 | Discharge: 2021-06-01 | Disposition: A | Discharge status: Home / Self Care | Payer: Medicare Other | Source: Ambulatory Visit | Attending: Family Medicine | Admitting: Family Medicine

## 2021-06-01 ENCOUNTER — Other Ambulatory Visit: Payer: Self-pay

## 2021-06-01 DIAGNOSIS — R609 Edema, unspecified: Secondary | ICD-10-CM | POA: Diagnosis present

## 2021-12-08 ENCOUNTER — Emergency Department (HOSPITAL_COMMUNITY): Payer: Medicare Other

## 2021-12-08 ENCOUNTER — Encounter (HOSPITAL_COMMUNITY): Payer: Self-pay | Admitting: *Deleted

## 2021-12-08 ENCOUNTER — Observation Stay (HOSPITAL_COMMUNITY)
Admission: EM | Admit: 2021-12-08 | Discharge: 2021-12-09 | Disposition: A | Discharge status: Home / Self Care | Payer: Medicare Other | Attending: Internal Medicine | Admitting: Internal Medicine

## 2021-12-08 DIAGNOSIS — K529 Noninfective gastroenteritis and colitis, unspecified: Principal | ICD-10-CM | POA: Diagnosis present

## 2021-12-08 DIAGNOSIS — Z20822 Contact with and (suspected) exposure to covid-19: Secondary | ICD-10-CM | POA: Insufficient documentation

## 2021-12-08 DIAGNOSIS — J9601 Acute respiratory failure with hypoxia: Secondary | ICD-10-CM | POA: Diagnosis not present

## 2021-12-08 DIAGNOSIS — R112 Nausea with vomiting, unspecified: Secondary | ICD-10-CM | POA: Diagnosis present

## 2021-12-08 DIAGNOSIS — R0789 Other chest pain: Secondary | ICD-10-CM | POA: Diagnosis not present

## 2021-12-08 DIAGNOSIS — R079 Chest pain, unspecified: Secondary | ICD-10-CM | POA: Diagnosis not present

## 2021-12-08 DIAGNOSIS — Z79899 Other long term (current) drug therapy: Secondary | ICD-10-CM | POA: Diagnosis not present

## 2021-12-08 DIAGNOSIS — E861 Hypovolemia: Principal | ICD-10-CM

## 2021-12-08 DIAGNOSIS — I10 Essential (primary) hypertension: Secondary | ICD-10-CM | POA: Diagnosis present

## 2021-12-08 DIAGNOSIS — Z7984 Long term (current) use of oral hypoglycemic drugs: Secondary | ICD-10-CM | POA: Insufficient documentation

## 2021-12-08 DIAGNOSIS — R6510 Systemic inflammatory response syndrome (SIRS) of non-infectious origin without acute organ dysfunction: Secondary | ICD-10-CM | POA: Diagnosis present

## 2021-12-08 DIAGNOSIS — E119 Type 2 diabetes mellitus without complications: Secondary | ICD-10-CM

## 2021-12-08 DIAGNOSIS — A419 Sepsis, unspecified organism: Secondary | ICD-10-CM

## 2021-12-08 LAB — URINALYSIS, ROUTINE W REFLEX MICROSCOPIC
Bilirubin Urine: NEGATIVE
Glucose, UA: NEGATIVE mg/dL
Hgb urine dipstick: NEGATIVE
Ketones, ur: NEGATIVE mg/dL
Leukocytes,Ua: NEGATIVE
Nitrite: NEGATIVE
Protein, ur: NEGATIVE mg/dL
Specific Gravity, Urine: 1.04 — ABNORMAL HIGH (ref 1.005–1.030)
pH: 5 (ref 5.0–8.0)

## 2021-12-08 LAB — CBC WITH DIFFERENTIAL/PLATELET
Abs Immature Granulocytes: 0.07 10*3/uL (ref 0.00–0.07)
Basophils Absolute: 0.1 10*3/uL (ref 0.0–0.1)
Basophils Relative: 0 %
Eosinophils Absolute: 0 10*3/uL (ref 0.0–0.5)
Eosinophils Relative: 0 %
HCT: 52.5 % — ABNORMAL HIGH (ref 39.0–52.0)
Hemoglobin: 18 g/dL — ABNORMAL HIGH (ref 13.0–17.0)
Immature Granulocytes: 0 %
Lymphocytes Relative: 3 %
Lymphs Abs: 0.5 10*3/uL — ABNORMAL LOW (ref 0.7–4.0)
MCH: 29.9 pg (ref 26.0–34.0)
MCHC: 34.3 g/dL (ref 30.0–36.0)
MCV: 87.1 fL (ref 80.0–100.0)
Monocytes Absolute: 0.8 10*3/uL (ref 0.1–1.0)
Monocytes Relative: 4 %
Neutro Abs: 16.8 10*3/uL — ABNORMAL HIGH (ref 1.7–7.7)
Neutrophils Relative %: 93 %
Platelets: 253 10*3/uL (ref 150–400)
RBC: 6.03 MIL/uL — ABNORMAL HIGH (ref 4.22–5.81)
RDW: 14.3 % (ref 11.5–15.5)
WBC: 18.2 10*3/uL — ABNORMAL HIGH (ref 4.0–10.5)
nRBC: 0 % (ref 0.0–0.2)

## 2021-12-08 LAB — TROPONIN I (HIGH SENSITIVITY)
Troponin I (High Sensitivity): 3 ng/L (ref <18)
Troponin I (High Sensitivity): 3 ng/L (ref <18)
Troponin I (High Sensitivity): 4 ng/L (ref <18)

## 2021-12-08 LAB — COMPREHENSIVE METABOLIC PANEL
ALT: 79 U/L — ABNORMAL HIGH (ref 0–44)
AST: 58 U/L — ABNORMAL HIGH (ref 15–41)
Albumin: 4.2 g/dL (ref 3.5–5.0)
Alkaline Phosphatase: 81 U/L (ref 38–126)
Anion gap: 13 (ref 5–15)
BUN: 20 mg/dL (ref 6–20)
CO2: 19 mmol/L — ABNORMAL LOW (ref 22–32)
Calcium: 9 mg/dL (ref 8.9–10.3)
Chloride: 102 mmol/L (ref 98–111)
Creatinine, Ser: 0.97 mg/dL (ref 0.61–1.24)
GFR, Estimated: >60 mL/min (ref >60)
Glucose, Bld: 229 mg/dL — ABNORMAL HIGH (ref 70–99)
Potassium: 4.3 mmol/L (ref 3.5–5.1)
Sodium: 134 mmol/L — ABNORMAL LOW (ref 135–145)
Total Bilirubin: 2 mg/dL — ABNORMAL HIGH (ref 0.3–1.2)
Total Protein: 7.8 g/dL (ref 6.5–8.1)

## 2021-12-08 LAB — PROTIME-INR
INR: 1.1 (ref 0.8–1.2)
Prothrombin Time: 13.9 seconds (ref 11.4–15.2)

## 2021-12-08 LAB — PROCALCITONIN: Procalcitonin: 1.6 ng/mL

## 2021-12-08 LAB — LACTIC ACID, PLASMA
Lactic Acid, Venous: 2.4 mmol/L (ref 0.5–1.9)
Lactic Acid, Venous: 2.5 mmol/L (ref 0.5–1.9)
Lactic Acid, Venous: 1.7 mmol/L (ref 0.5–1.9)

## 2021-12-08 LAB — RESP PANEL BY RT-PCR (FLU A&B, COVID) ARPGX2
Influenza A by PCR: NEGATIVE
Influenza B by PCR: NEGATIVE
SARS Coronavirus 2 by RT PCR: NEGATIVE

## 2021-12-08 LAB — CBG MONITORING, ED
Glucose-Capillary: 233 mg/dL — ABNORMAL HIGH (ref 70–99)
Glucose-Capillary: 149 mg/dL — ABNORMAL HIGH (ref 70–99)
Glucose-Capillary: 162 mg/dL — ABNORMAL HIGH (ref 70–99)

## 2021-12-08 LAB — MAGNESIUM: Magnesium: 1.6 mg/dL — ABNORMAL LOW (ref 1.7–2.4)

## 2021-12-08 LAB — C DIFFICILE QUICK SCREEN W PCR REFLEX
C Diff antigen: NEGATIVE
C Diff interpretation: NOT DETECTED
C Diff toxin: NEGATIVE

## 2021-12-08 LAB — APTT: aPTT: 29 seconds (ref 24–36)

## 2021-12-08 LAB — LIPASE, BLOOD: Lipase: 29 U/L (ref 11–51)

## 2021-12-08 MED ORDER — CALCIUM CARBONATE ANTACID 500 MG PO CHEW
1.0000 | CHEWABLE_TABLET | Freq: Once | ORAL | Status: AC
  Administered 2021-12-08: 200 mg via ORAL
  Filled 2021-12-08: qty 1

## 2021-12-08 MED ORDER — POLYETHYLENE GLYCOL 3350 17 G PO PACK: 17.0000 g | PACK | Freq: Every day | ORAL | Status: DC | PRN

## 2021-12-08 MED ORDER — SODIUM CHLORIDE 0.9 % IV BOLUS
1000.0000 mL | Freq: Once | INTRAVENOUS | Status: AC
  Administered 2021-12-08: 1000 mL via INTRAVENOUS

## 2021-12-08 MED ORDER — AMLODIPINE BESYLATE 5 MG PO TABS: 5.0000 mg | ORAL_TABLET | Freq: Every day | ORAL | Status: DC

## 2021-12-08 MED ORDER — SODIUM CHLORIDE 0.9 % IV SOLN
2.0000 g | Freq: Three times a day (TID) | INTRAVENOUS | Status: DC
  Administered 2021-12-08 – 2021-12-09 (×2): 2 g via INTRAVENOUS
  Filled 2021-12-08 (×2): qty 2

## 2021-12-08 MED ORDER — ACETAMINOPHEN 325 MG PO TABS: 650.0000 mg | ORAL_TABLET | Freq: Four times a day (QID) | ORAL | Status: DC | PRN

## 2021-12-08 MED ORDER — CYCLOBENZAPRINE HCL 10 MG PO TABS
10.0000 mg | ORAL_TABLET | Freq: Three times a day (TID) | ORAL | Status: DC | PRN
  Administered 2021-12-08: 10 mg via ORAL
  Filled 2021-12-08 (×2): qty 1

## 2021-12-08 MED ORDER — ONDANSETRON HCL 4 MG PO TABS: 4.0000 mg | ORAL_TABLET | Freq: Four times a day (QID) | ORAL | Status: DC | PRN

## 2021-12-08 MED ORDER — ONDANSETRON HCL 4 MG/2ML IJ SOLN
4.0000 mg | Freq: Once | INTRAMUSCULAR | Status: AC
  Administered 2021-12-08: 4 mg via INTRAVENOUS
  Filled 2021-12-08: qty 2

## 2021-12-08 MED ORDER — SODIUM CHLORIDE 0.9 % IV BOLUS (SEPSIS)
1000.0000 mL | Freq: Once | INTRAVENOUS | Status: AC
  Administered 2021-12-08: 1000 mL via INTRAVENOUS

## 2021-12-08 MED ORDER — METRONIDAZOLE 500 MG/100ML IV SOLN
500.0000 mg | Freq: Once | INTRAVENOUS | Status: AC
  Administered 2021-12-08: 500 mg via INTRAVENOUS
  Filled 2021-12-08: qty 100

## 2021-12-08 MED ORDER — ACETAMINOPHEN 650 MG RE SUPP: 650.0000 mg | Freq: Four times a day (QID) | RECTAL | Status: DC | PRN

## 2021-12-08 MED ORDER — ENOXAPARIN SODIUM 40 MG/0.4ML IJ SOSY
40.0000 mg | PREFILLED_SYRINGE | INTRAMUSCULAR | Status: DC
  Administered 2021-12-08: 40 mg via SUBCUTANEOUS
  Filled 2021-12-08: qty 0.4

## 2021-12-08 MED ORDER — METRONIDAZOLE 500 MG/100ML IV SOLN
500.0000 mg | Freq: Two times a day (BID) | INTRAVENOUS | Status: DC
  Administered 2021-12-09: 500 mg via INTRAVENOUS
  Filled 2021-12-08: qty 100

## 2021-12-08 MED ORDER — SODIUM CHLORIDE 0.9 % IV SOLN: INTRAVENOUS | Status: DC

## 2021-12-08 MED ORDER — INSULIN ASPART 100 UNIT/ML IJ SOLN
0.0000 [IU] | Freq: Four times a day (QID) | INTRAMUSCULAR | Status: DC
  Administered 2021-12-08 – 2021-12-09 (×2): 1 [IU] via SUBCUTANEOUS
  Filled 2021-12-08: qty 1

## 2021-12-08 MED ORDER — SODIUM CHLORIDE 0.9 % IV SOLN
2.0000 g | Freq: Once | INTRAVENOUS | Status: AC
  Administered 2021-12-08: 2 g via INTRAVENOUS
  Filled 2021-12-08: qty 2

## 2021-12-08 MED ORDER — ONDANSETRON HCL 4 MG/2ML IJ SOLN
4.0000 mg | Freq: Four times a day (QID) | INTRAMUSCULAR | Status: DC | PRN
Start: 2021-12-08 — End: 2021-12-09

## 2021-12-08 MED ORDER — VANCOMYCIN HCL 2000 MG/400ML IV SOLN
2000.0000 mg | Freq: Once | INTRAVENOUS | Status: AC
  Administered 2021-12-08: 2000 mg via INTRAVENOUS
  Filled 2021-12-08: qty 400

## 2021-12-08 MED ORDER — VANCOMYCIN HCL 1250 MG/250ML IV SOLN
1250.0000 mg | Freq: Two times a day (BID) | INTRAVENOUS | Status: DC
  Administered 2021-12-09: 1250 mg via INTRAVENOUS
  Filled 2021-12-08: qty 250

## 2021-12-08 MED ORDER — IOHEXOL 350 MG/ML SOLN
100.0000 mL | Freq: Once | INTRAVENOUS | Status: AC | PRN
  Administered 2021-12-08: 100 mL via INTRAVENOUS

## 2021-12-08 MED ORDER — LACTATED RINGERS IV SOLN: INTRAVENOUS | Status: DC

## 2021-12-08 MED ORDER — VANCOMYCIN HCL IN DEXTROSE 1-5 GM/200ML-% IV SOLN: 1000.0000 mg | Freq: Once | INTRAVENOUS | Status: DC

## 2021-12-08 MED ORDER — OXYCODONE HCL ER 15 MG PO T12A
15.0000 mg | EXTENDED_RELEASE_TABLET | Freq: Two times a day (BID) | ORAL | Status: DC
Start: 2021-12-08 — End: 2021-12-09
  Administered 2021-12-08 – 2021-12-09 (×2): 15 mg via ORAL
  Filled 2021-12-08 (×2): qty 1

## 2021-12-08 NOTE — Assessment & Plan Note (Signed)
Persistent chest pain since onset, in the setting of SIRS, and multiple episodes of vomiting.  No family or personal history of cardiac disease. Never smoked cigarettes.  Troponins EKG unremarkable. ?-Repeat troponin x 1 ?-EKG in the morning ?

## 2021-12-08 NOTE — ED Triage Notes (Signed)
Multiple complaints, nausea and vomiting, diarrhea and chest pain ?

## 2021-12-08 NOTE — ED Provider Notes (Signed)
?[facility] ?Provider Note ? ? ?CSN: [id number] ?Arrival date & time: 12/08/21  1244 ? ?  ? ?History ?PMH: HTN, GERD, Obesity, DM ?Chief Complaint  ?Patient presents with  ? Emesis  ?  , diarrhea  ? ? ?Brad [name] is a 56 y.o. male.  Patient presents the emergency department the chief complaint of nausea, vomiting, diarrhea, chest pain, shortness of breath.  He states that around 3 AM this morning he started having sudden onset nausea and vomiting with associated diarrhea.  He has had 8-9 episodes of diarrhea of nonbloody stool.  He has had multiple episodes of emesis with no blood noted.  He says around 3 hours ago, he noticed sudden onset central chest pain that radiates to his back.  He says at that time he noticed associated shortness of breath.  The chest pain has been constant. It feels like an elephant is sitting on his chest. He has never had a feeling like this before.  Nothing is made it better or worse.  He feels like that it is gradually worsening since it started.  He has associated abdominal pain and dizziness.  He did say that he had a notable fever at home to 102. He has no history of cardiac disease.  He says at one point he was post to be evaluated by cardiologist, however he never ended up having this done.  Denies taking any aspirin or nitroglycerin.  He denies any evidence of blood clot in his past.  Denies any leg swelling.  ? ? ?Emesis ?Associated symptoms: abdominal pain, diarrhea and fever   ? ?  ? ?Home Medications ?Prior to Admission medications   ?Medication Sig Start Date End Date Taking? Authorizing Provider  ?amLODipine-benazepril (LOTREL) 5-20 MG capsule TAKE 1 CAPSULE BY MOUTH EVERY DAY 12/25/19  Yes Branch, Alphonse Guild, MD  ?cyclobenzaprine (FLEXERIL) 10 MG tablet Take 1 tablet (10 mg total) by mouth 3 (three) times daily as needed for muscle spasms. 05/23/14  Yes Kary Kos, MD  ?metFORMIN (GLUCOPHAGE) 1000 MG tablet Take 1,000 mg by mouth 2 (two) times daily  with a meal.   Yes [provider]  ?Multiple Vitamins-Minerals (MULTIVITAMIN WITH MINERALS) tablet Take 1 tablet by mouth daily.   Yes [provider]  ?OxyCODONE (OXYCONTIN) 15 mg T12A 12 hr tablet Take 15 mg by mouth every 6 (six) hours as needed.    Yes [provider]  ?testosterone cypionate (DEPOTESTOSTERONE CYPIONATE) 200 MG/ML injection Inject 200 mg into the muscle every 30 (thirty) days. 11/30/21  Yes [provider]  ?Vitamin D, Ergocalciferol, (DRISDOL) 1.25 MG (50000 UNIT) CAPS capsule Take 50,000 Units by mouth every 7 (seven) days. Sunday 12/01/21  Yes [provider]  ?   ? ?Allergies    ?Patient has no known allergies.   ? ?Review of Systems   ?Review of Systems  ?Constitutional:  Positive for fever.  ?Respiratory:  Positive for shortness of breath.   ?Cardiovascular:  Positive for chest pain. Negative for leg swelling.  ?Gastrointestinal:  Positive for abdominal pain, diarrhea, nausea and vomiting. Negative for abdominal distention and blood in stool.  ?Neurological:  Positive for dizziness.  ?All other systems reviewed and are negative. ? ?Physical Exam ?Updated Vital Signs ?BP 124/78   Pulse (!) 117   Temp 98.1 ?F (36.7 ?C) (Oral)   Resp 19   Ht '6\' 2"'$  (1.88 m)   Wt 122.5 kg   SpO2 94%   BMI 34.67 kg/m?  ?  Physical Exam ?Vitals and nursing note reviewed.  ?Constitutional:   ?   General: He is in acute distress.  ?   Appearance: Normal appearance. He is ill-appearing and diaphoretic. He is not toxic-appearing.  ?HENT:  ?   Head: Normocephalic and atraumatic.  ?   Nose: No nasal deformity.  ?   Mouth/Throat:  ?   Lips: Pink. No lesions.  ?   Mouth: Mucous membranes are moist. No injury, lacerations, oral lesions or angioedema.  ?   Pharynx: Oropharynx is clear. Uvula midline. No pharyngeal swelling, oropharyngeal exudate, posterior oropharyngeal erythema or uvula swelling.  ?Eyes:  ?   General: Gaze aligned appropriately. No scleral icterus.    ?    Right eye: No discharge.     ?   Left eye: No discharge.  ?   Conjunctiva/sclera: Conjunctivae normal.  ?   Right eye: Right conjunctiva is not injected. No exudate or hemorrhage. ?   Left eye: Left conjunctiva is not injected. No exudate or hemorrhage. ?Cardiovascular:  ?   Rate and Rhythm: Regular rhythm. Tachycardia present.  ?   Pulses: Normal pulses.     ?     Radial pulses are 2+ on the right side and 2+ on the left side.  ?     Dorsalis pedis pulses are 2+ on the right side and 2+ on the left side.  ?   Heart sounds: Normal heart sounds, S1 normal and S2 normal. Heart sounds not distant. No murmur heard. ?  No friction rub. No gallop. No S3 or S4 sounds.  ?Pulmonary:  ?   Effort: Tachypnea present. No accessory muscle usage or respiratory distress.  ?   Breath sounds: Normal breath sounds. No stridor. No wheezing, rhonchi or rales.  ?Chest:  ?   Chest wall: No tenderness.  ?Abdominal:  ?   General: Abdomen is flat. Bowel sounds are normal. There is no distension.  ?   Palpations: Abdomen is soft. There is no mass or pulsatile mass.  ?   Tenderness: There is abdominal tenderness in the epigastric area and periumbilical area. There is no guarding or rebound.  ?Musculoskeletal:  ?   Right lower leg: No edema.  ?   Left lower leg: No edema.  ?Skin: ?   General: Skin is warm.  ?   Coloration: Skin is not jaundiced or pale.  ?   Findings: No bruising, erythema, lesion or rash.  ?Neurological:  ?   General: No focal deficit present.  ?   Mental Status: He is alert and oriented to person, place, and time.  ?   GCS: GCS eye subscore is 4. GCS verbal subscore is 5. GCS motor subscore is 6.  ?Psychiatric:     ?   Mood and Affect: Mood normal.     ?   Behavior: Behavior normal. Behavior is cooperative.  ? ? ?ED Results / Procedures / Treatments   ?Labs ?(all labs ordered are listed, but only abnormal results are displayed) ?Labs Reviewed  ?CBC WITH DIFFERENTIAL/PLATELET - Abnormal; Notable for the following components:   ?    Result Value  ? WBC 18.2 (*)   ? RBC 6.03 (*)   ? Hemoglobin 18.0 (*)   ? HCT 52.5 (*)   ? Neutro Abs 16.8 (*)   ? Lymphs Abs 0.5 (*)   ? All other components within normal limits  ?COMPREHENSIVE METABOLIC PANEL - Abnormal; Notable for the following components:  ? Sodium 134 (*)   ?  CO2 19 (*)   ? Glucose, Bld 229 (*)   ? AST 58 (*)   ? ALT 79 (*)   ? Total Bilirubin 2.0 (*)   ? All other components within normal limits  ?URINALYSIS, ROUTINE W REFLEX MICROSCOPIC - Abnormal; Notable for the following components:  ? Specific Gravity, Urine 1.040 (*)   ? All other components within normal limits  ?LACTIC ACID, PLASMA - Abnormal; Notable for the following components:  ? Lactic Acid, Venous 2.4 (*)   ? All other components within normal limits  ?LACTIC ACID, PLASMA - Abnormal; Notable for the following components:  ? Lactic Acid, Venous 2.5 (*)   ? All other components within normal limits  ?MAGNESIUM - Abnormal; Notable for the following components:  ? Magnesium 1.6 (*)   ? All other components within normal limits  ?CBG MONITORING, ED - Abnormal; Notable for the following components:  ? Glucose-Capillary 233 (*)   ? All other components within normal limits  ?CBG MONITORING, ED - Abnormal; Notable for the following components:  ? Glucose-Capillary 162 (*)   ? All other components within normal limits  ?CBG MONITORING, ED - Abnormal; Notable for the following components:  ? Glucose-Capillary 149 (*)   ? All other components within normal limits  ?RESP PANEL BY RT-PCR (FLU A&B, COVID) [id number]  ?CULTURE, BLOOD (ROUTINE X 2)  ?CULTURE, BLOOD (ROUTINE X 2)  ?C DIFFICILE QUICK SCREEN W PCR REFLEX    ?GASTROINTESTINAL PANEL BY PCR, STOOL (REPLACES STOOL CULTURE)  ?URINE CULTURE  ?LIPASE, BLOOD  ?PROTIME-INR  ?APTT  ?PROCALCITONIN  ?PROCALCITONIN  ?LACTIC ACID, PLASMA  ?HIV ANTIBODY (ROUTINE TESTING W REFLEX)  ?BASIC METABOLIC PANEL  ?CBC  ?HEMOGLOBIN A1C  ?TROPONIN I (HIGH SENSITIVITY)  ?TROPONIN I (HIGH SENSITIVITY)   ?TROPONIN I (HIGH SENSITIVITY)  ? ? ?EKG ?EKG Interpretation ? ?Date/Time:  Thursday December 08 2021 13:04:17 EDT ?Ventricular Rate:  154 ?PR Interval:  124 ?QRS Duration: 72 ?QT Interval:  252 ?QTC Calculation: 403

## 2021-12-08 NOTE — Assessment & Plan Note (Signed)
O2 sats 88% on room air.  Placed on 2 L.  In the setting of SIRS.  CTA chest without acute abnormality, no wheezing or rhonchi on exam, never smoked cigarettes.  No history of lung disease. ?-Supplimental o2 ?

## 2021-12-08 NOTE — ED Notes (Signed)
Assisted patient to restroom with standby assist. Patient was a little unsteady on feet. Patient states that he felt a little weak and was hot. Patient's room is very warm. ?

## 2021-12-08 NOTE — Assessment & Plan Note (Signed)
Hold home metformin.   ?- SSI- S ?- HgbA1C ?

## 2021-12-08 NOTE — ED Notes (Signed)
Placed pt back on pulse ox. Pt O2 sat at 88%. Placed pt on 2L [place]. Pt O2 sat returned to 93% ?

## 2021-12-08 NOTE — ED Notes (Signed)
Pt O2 sat at 91%. O2 up to 3L [place]. Pt O2 Sat now at 94% ?

## 2021-12-08 NOTE — Assessment & Plan Note (Addendum)
Tachycardic heart rate 120s to 150s.  Respiratory rate 12-23.  Leukocytosis of 18.2.  Tmax 99.2.  Lactic acidosis 2.4 > 2.5.  Reports fever of 102 at home.  Presenting with vomiting and diarrhea.  CTA chest, CT abdomen and pelvis negative for acute abnormality to explain above presentation. ?-Stool C. difficile negative ?-GI pathogen panel pending ?-Broad-spectrum antibiotics IV Vanco cefepime and metronidazole ?-Follow-up blood and urine cultures ?-Procalcitonin ?-3 L sepsis fluid bolus given, continue N/s 100cc/hr x 20hrs ?-Bowel rest with clear liquid diet ?

## 2021-12-08 NOTE — H&P (Addendum)
History and Physical    Brad [name] NFA:[id number] DOB: 1966/01/05 DOA: 12/08/2021  PCP: Oneal Grout, FNP   Patient coming from: Home  I have personally briefly reviewed patient's old medical records in [facility]  Chief Complaint: Diarrhea, vomiting.  HPI: Brad [name] is a 56 y.o. male with medical history significant for hypertension, diabetes mellitus, NAFLD. Patient presented to the ED with complaints of multiple episodes of loose stools that started at about 3 AM early this morning.  He has also had multiple episodes of vomiting since then.  Also an episode of crampy abdominal pain with diarrhea but that has since resolved.  Today at about 9 AM he stated having persistent mid chest pain, with associated difficulty breathing.  Chest pain is nonradiating.  He describes it like an elephant sitting on his chest.  He denies prior chest pains.  He was lying in bed when chest pain started.  No aggravating or relieving factors.  No family history of cardiac disease.  He has never smoked cigarettes.  He has no personal cardiac history. Patient reports a temperature of 102 at home.  No pain with urination, no cough, no neck pain or stiffness, reports a mild headache that is resolved.    ED Course: Tachycardiac to 150s.  Tmax 99.2.  Respiratory rate 13-23.  Blood pressure systolic 120s to 469G.  O2 sats 88% on room air.  He was placed on 2 L nasal cannula.  Lactic acidosis of 2.4 > 2.5.  Magnesium 1.6.  Troponin 3 X 2.  EKG shows sinus tachycardia without ST or T wave abnormalities.  Stool studies obtained in the ED.  COVID test negative.  CT abdomen and pelvis CTA chest obtained all negative for acute abnormality.  Blood cultures obtained.  Broad-spectrum antibiotics IV Vanco cefepime and metronidazole started.  Sepsis fluid bolus-3 L given.  Review of Systems: As per HPI all other systems reviewed and negative.  Past Medical History:  Diagnosis Date   Allergy    Anxiety     Arthritis    ? in back    Chest pain, unspecified    Diabetes mellitus without complication (HCC)    GERD (gastroesophageal reflux disease)    Obesity, unspecified    Other abnormal blood chemistry    Other chronic nonalcoholic liver disease    Unspecified essential hypertension     Past Surgical History:  Procedure Laterality Date   BACK SURGERY     x 4   CHOLECYSTECTOMY     COLONOSCOPY     > 20 yrs ago per pt    HERNIA REPAIR     CHILD   LUMBAR LAMINECTOMY/DECOMPRESSION MICRODISCECTOMY  11/29/2011   Procedure: LUMBAR LAMINECTOMY/DECOMPRESSION MICRODISCECTOMY;  Surgeon: Mariam Dollar, MD;  Location: [facility];  Service: Neurosurgery;  Laterality: N/A;  Lumbar Laminectomy/Discectomy Decompression Thoracic Tweleve-Lumbar One, Lumbar Four-Five     reports that he has never smoked. He has never used smokeless tobacco. He reports that he does not drink alcohol and does not use drugs.  No Known Allergies  Family History  Problem Relation Age of Onset   Hypertension Mother    Coronary artery disease Other        FATHER   Colon cancer Neg Hx    Colon polyps Neg Hx    Esophageal cancer Neg Hx    Rectal cancer Neg Hx    Stomach cancer Neg Hx     Prior to Admission medications   Medication  Sig Start Date End Date Taking? Authorizing Provider  amLODipine-benazepril (LOTREL) 5-20 MG capsule TAKE 1 CAPSULE BY MOUTH EVERY DAY 12/25/19  Yes Branch, Dorothe Pea, MD  cyclobenzaprine (FLEXERIL) 10 MG tablet Take 1 tablet (10 mg total) by mouth 3 (three) times daily as needed for muscle spasms. 05/23/14  Yes Donalee Citrin, MD  metFORMIN (GLUCOPHAGE) 1000 MG tablet Take 1,000 mg by mouth 2 (two) times daily with a meal.   Yes [provider]  Multiple Vitamins-Minerals (MULTIVITAMIN WITH MINERALS) tablet Take 1 tablet by mouth daily.   Yes [provider]  OxyCODONE (OXYCONTIN) 15 mg T12A 12 hr tablet Take 15 mg by mouth every 6 (six) hours as needed.    Yes [provider]  testosterone cypionate (DEPOTESTOSTERONE CYPIONATE) 200 MG/ML injection Inject 200 mg into the muscle every 30 (thirty) days. 11/30/21  Yes [provider]  Vitamin D, Ergocalciferol, (DRISDOL) 1.25 MG (50000 UNIT) CAPS capsule Take 50,000 Units by mouth every 7 (seven) days. Sunday 12/01/21  Yes [provider]    Physical Exam: Vitals:   12/08/21 1730 12/08/21 1759 12/08/21 1800 12/08/21 1822  BP: 114/70  124/78   Pulse: (!) 120 (!) 120 (!) 117 (!) 119  Resp: (!) 26 14 16 16   Temp:    99.2 F (37.3 C)  TempSrc:    Oral  SpO2: 93% 95% 92% 95%  Weight:      Height:        Constitutional: NAD, calm, comfortable Vitals:   12/08/21 1730 12/08/21 1759 12/08/21 1800 12/08/21 1822  BP: 114/70  124/78   Pulse: (!) 120 (!) 120 (!) 117 (!) 119  Resp: (!) 26 14 16 16   Temp:    99.2 F (37.3 C)  TempSrc:    Oral  SpO2: 93% 95% 92% 95%  Weight:      Height:       Eyes: PERRL, lids and conjunctivae normal ENMT: Mucous membranes are moist  Neck: normal, supple, no masses, no thyromegaly Respiratory: clear to auscultation bilaterally, no wheezing, no crackles. Normal respiratory effort. No accessory muscle use.  Cardiovascular: Tachycardic, regular rate and rhythm, no murmurs / rubs / gallops.  Trace bilateral pitting lower extremity edema, patient reports this is chronic and unchanged for which he wears compression stockings.   2+ pedal pulses. .  Abdomen: no tenderness, no masses palpated. No hepatosplenomegaly. Bowel sounds positive.  Musculoskeletal: no clubbing / cyanosis. No joint deformity upper and lower extremities. Good ROM, no contractures. Normal muscle tone.  Skin: no rashes, lesions, ulcers. No induration Neurologic: No apparent cranial nerve abnormality, moving extremities spontaneously.  Psychiatric: Normal judgment and insight. Alert and oriented x 3. Normal mood.   Labs on Admission: I have personally reviewed following labs and imaging  studies  CBC: Recent Labs  Lab 12/08/21 1328  WBC 18.2*  NEUTROABS 16.8*  HGB 18.0*  HCT 52.5*  MCV 87.1  PLT 253   Basic Metabolic Panel: Recent Labs  Lab 12/08/21 1328 12/08/21 1339  NA 134*  --   K 4.3  --   CL 102  --   CO2 19*  --   GLUCOSE 229*  --   BUN 20  --   CREATININE 0.97  --   CALCIUM 9.0  --   MG  --  1.6*   GFR: Estimated Creatinine Clearance: 121 mL/min (by C-G formula based on SCr of 0.97 mg/dL). Liver Function Tests: Recent Labs  Lab 12/08/21 1328  AST 58*  ALT 79*  ALKPHOS 81  BILITOT 2.0*  PROT 7.8  ALBUMIN 4.2   Recent Labs  Lab 12/08/21 1328  LIPASE 29   No results for input(s): AMMONIA in the last 168 hours. Coagulation Profile: Recent Labs  Lab 12/08/21 1452  INR 1.1   CBG: Recent Labs  Lab 12/08/21 1327  GLUCAP 233*    Urine analysis:    Component Value Date/Time   COLORURINE YELLOW 12/08/2021 1634   APPEARANCEUR CLEAR 12/08/2021 1634   LABSPEC 1.040 (H) 12/08/2021 1634   PHURINE 5.0 12/08/2021 1634   GLUCOSEU NEGATIVE 12/08/2021 1634   HGBUR NEGATIVE 12/08/2021 1634   BILIRUBINUR NEGATIVE 12/08/2021 1634   KETONESUR NEGATIVE 12/08/2021 1634   PROTEINUR NEGATIVE 12/08/2021 1634   NITRITE NEGATIVE 12/08/2021 1634   LEUKOCYTESUR NEGATIVE 12/08/2021 1634    Radiological Exams on Admission: CT Angio Chest PE W/Cm &/Or Wo Cm  Result Date: 12/08/2021 CLINICAL DATA:  Chest pain and hypoxia. Nausea, vomiting, and diarrhea. EXAM: CT ANGIOGRAPHY CHEST CT ABDOMEN AND PELVIS WITH CONTRAST TECHNIQUE: Multidetector CT imaging of the chest was performed using the standard protocol during bolus administration of intravenous contrast. Multiplanar CT image reconstructions and MIPs were obtained to evaluate the vascular anatomy. Multidetector CT imaging of the abdomen and pelvis was performed using the standard protocol during bolus administration of intravenous contrast. RADIATION DOSE REDUCTION: This exam was performed according  to the departmental dose-optimization program which includes automated exposure control, adjustment of the mA and/or kV according to patient size and/or use of iterative reconstruction technique. CONTRAST:  OMNIPAQUE IOHEXOL 350 MG/ML SOLN COMPARISON:  None. FINDINGS: CTA CHEST FINDINGS Cardiovascular: Satisfactory opacification of the pulmonary arteries to the segmental level. No evidence of pulmonary embolism. Normal heart size. No pericardial effusion. No thoracic aortic aneurysm or dissection. Mediastinum/Nodes: No enlarged mediastinal, hilar, or axillary lymph nodes. Thyroid gland, trachea, and esophagus demonstrate no significant findings. Lungs/Pleura: No focal consolidation, pleural effusion, or pneumothorax. Mild dependent subsegmental atelectasis in both lower lobes in the left upper lobe. Musculoskeletal: Bilateral gynecomastia. No acute or significant osseous findings. Review of the MIP images confirms the above findings. CT ABDOMEN and PELVIS FINDINGS Hepatobiliary: No focal liver abnormality is seen. Status post cholecystectomy. No biliary dilatation. Pancreas: Unremarkable. No pancreatic ductal dilatation or surrounding inflammatory changes. Spleen: Normal in size without focal abnormality. Adrenals/Urinary Tract: Adrenal glands are unremarkable. Kidneys are normal, without renal calculi, focal lesion, or hydronephrosis. Bladder is unremarkable. Stomach/Bowel: Stomach is within normal limits. Appendix appears normal. No evidence of bowel wall thickening, distention, or inflammatory changes. Liquid stool throughout the colon, consistent with reported diarrheal illness. Vascular/Lymphatic: Aortic atherosclerosis. No enlarged abdominal or pelvic lymph nodes. Reproductive: Prostate is unremarkable. Other: No free fluid or pneumoperitoneum. Musculoskeletal: No acute or significant osseous findings. Prior lower lumbar posterior decompression and interbody fusion. Review of the MIP images confirms the  above findings. IMPRESSION: 1. No acute pulmonary embolism.  No acute intrathoracic process. 2. No acute intra-abdominal process. 3. Aortic Atherosclerosis (ICD10-I70.0). Electronically Signed   By: Obie Dredge M.D.   On: 12/08/2021 15:48   CT Abdomen Pelvis W Contrast  Result Date: 12/08/2021 CLINICAL DATA:  Chest pain and hypoxia. Nausea, vomiting, and diarrhea. EXAM: CT ANGIOGRAPHY CHEST CT ABDOMEN AND PELVIS WITH CONTRAST TECHNIQUE: Multidetector CT imaging of the chest was performed using the standard protocol during bolus administration of intravenous contrast. Multiplanar CT image reconstructions and MIPs were obtained to evaluate the vascular anatomy. Multidetector CT imaging of the abdomen and pelvis was  performed using the standard protocol during bolus administration of intravenous contrast. RADIATION DOSE REDUCTION: This exam was performed according to the departmental dose-optimization program which includes automated exposure control, adjustment of the mA and/or kV according to patient size and/or use of iterative reconstruction technique. CONTRAST:  OMNIPAQUE IOHEXOL 350 MG/ML SOLN COMPARISON:  None. FINDINGS: CTA CHEST FINDINGS Cardiovascular: Satisfactory opacification of the pulmonary arteries to the segmental level. No evidence of pulmonary embolism. Normal heart size. No pericardial effusion. No thoracic aortic aneurysm or dissection. Mediastinum/Nodes: No enlarged mediastinal, hilar, or axillary lymph nodes. Thyroid gland, trachea, and esophagus demonstrate no significant findings. Lungs/Pleura: No focal consolidation, pleural effusion, or pneumothorax. Mild dependent subsegmental atelectasis in both lower lobes in the left upper lobe. Musculoskeletal: Bilateral gynecomastia. No acute or significant osseous findings. Review of the MIP images confirms the above findings. CT ABDOMEN and PELVIS FINDINGS Hepatobiliary: No focal liver abnormality is seen. Status post cholecystectomy. No  biliary dilatation. Pancreas: Unremarkable. No pancreatic ductal dilatation or surrounding inflammatory changes. Spleen: Normal in size without focal abnormality. Adrenals/Urinary Tract: Adrenal glands are unremarkable. Kidneys are normal, without renal calculi, focal lesion, or hydronephrosis. Bladder is unremarkable. Stomach/Bowel: Stomach is within normal limits. Appendix appears normal. No evidence of bowel wall thickening, distention, or inflammatory changes. Liquid stool throughout the colon, consistent with reported diarrheal illness. Vascular/Lymphatic: Aortic atherosclerosis. No enlarged abdominal or pelvic lymph nodes. Reproductive: Prostate is unremarkable. Other: No free fluid or pneumoperitoneum. Musculoskeletal: No acute or significant osseous findings. Prior lower lumbar posterior decompression and interbody fusion. Review of the MIP images confirms the above findings. IMPRESSION: 1. No acute pulmonary embolism.  No acute intrathoracic process. 2. No acute intra-abdominal process. 3. Aortic Atherosclerosis (ICD10-I70.0). Electronically Signed   By: Obie Dredge M.D.   On: 12/08/2021 15:48   DG Chest Port 1 View  Result Date: 12/08/2021 CLINICAL DATA:  Chest pain EXAM: PORTABLE CHEST 1 VIEW COMPARISON:  Chest x-ray 05/14/2014 FINDINGS: Cardiomediastinal silhouette appears stable. Heart is upper normal size. Mildly low lung volumes. No focal consolidation, pleural effusion or pneumothorax identified. IMPRESSION: No acute intrathoracic process identified. Electronically Signed   By: Jannifer Hick M.D.   On: 12/08/2021 13:42    EKG: Independently reviewed.  Sinus tachycardic, heart rate 154, QTc 403.  Assessment/Plan Principal Problem:   SIRS (systemic inflammatory response syndrome) (HCC) Active Problems:   Chest pain   Hypertension   Diabetes (HCC)   Acute respiratory failure with hypoxia (HCC)   Assessment and Plan: * SIRS (systemic inflammatory response syndrome)  (HCC) Tachycardic heart rate 120s to 150s.  Respiratory rate 12-23.  Leukocytosis of 18.2.  Tmax 99.2.  Lactic acidosis 2.4 > 2.5.  Reports fever of 102 at home.  Presenting with vomiting and diarrhea.  CTA chest, CT abdomen and pelvis negative for acute abnormality to explain above presentation. -Stool C. difficile negative -GI pathogen panel pending -Broad-spectrum antibiotics IV Vanco cefepime and metronidazole -Follow-up blood and urine cultures -Procalcitonin -3 L sepsis fluid bolus given, continue N/s 100cc/hr x 20hrs -Bowel rest with clear liquid diet  Chest pain Persistent chest pain since onset, in the setting of SIRS, and multiple episodes of vomiting.  No family or personal history of cardiac disease. Never smoked cigarettes.  Troponins EKG unremarkable. -Repeat troponin x 1 -EKG in the morning  Acute respiratory failure with hypoxia (HCC) O2 sats 88% on room air.  Placed on 2 L.  In the setting of SIRS.  CTA chest without acute abnormality, no wheezing or rhonchi  on exam, never smoked cigarettes.  No history of lung disease. -Supplimental o2  Diabetes (HCC) Hold home metformin.   - SSI- S - HgbA1C  Hypertension Stable 120s to 130s.  -Resume amlodipine 5 mg daily in a.m. -Hold benazepril 20 mg daily with contrast exposure   DVT prophylaxis: Lovenox Code Status:  Full Family Communication: Spouse at bedside Disposition Plan:  > 2 days Consults called: None Admission status: Inpt tele  I certify that at the point of admission it is my clinical judgment that the patient will require inpatient hospital care spanning beyond 2 midnights from the point of admission due to high intensity of service, high risk for further deterioration and high frequency of surveillance required.    Onnie Boer MD [facility]  12/08/2021, 7:01 PM

## 2021-12-08 NOTE — ED Notes (Signed)
Pts O2 88% on RA, placed on 2L ?

## 2021-12-08 NOTE — Assessment & Plan Note (Addendum)
Stable 120s to 130s.  ?-Resume amlodipine 5 mg daily in a.m. ?-Hold benazepril 20 mg daily with contrast exposure ?

## 2021-12-08 NOTE — ED Notes (Addendum)
Second set of BC's done by lab.  ?

## 2021-12-08 NOTE — Progress Notes (Signed)
Pharmacy Antibiotic Note ? ?Brad [name] a 56 y.o. male admitted on 12/08/2021 with sepsis.  Pharmacy has been consulted for vancomycin and cefepime dosing. ? ?Plan: ?Vancomycin '1250mg'$  IV every 12 hours.  Goal trough 15-20 mcg/mL. ?Cefepime 2gm IV every 8 hours. ? ?Medical History: ?Past Medical History:  ?Diagnosis Date  ? Allergy   ? Anxiety   ? Arthritis   ? ? in back   ? Chest pain, unspecified   ? Diabetes mellitus without complication ([place])   ? GERD (gastroesophageal reflux disease)   ? Obesity, unspecified   ? Other abnormal blood chemistry   ? Other chronic nonalcoholic liver disease   ? Unspecified essential hypertension   ? ? ?Allergies:  ?No Known Allergies ? ?Filed Weights  ? 12/08/21 1301  ?Weight: 122.5 kg (270 lb)  ? ? ? ?  Latest Ref Rng & Units 12/08/2021  ?  1:28 PM 01/10/2016  ?  8:48 AM 05/14/2014  ? 10:04 AM  ?CBC  ?WBC 4.0 - 10.5 K/uL 18.2   5.9   7.4    ?Hemoglobin 13.0 - 17.0 g/dL 18.0   15.0   16.0    ?Hematocrit 39.0 - 52.0 % 52.5   43.9   44.6    ?Platelets 150 - 400 K/uL 253   147   143    ? ? ? ?Estimated Creatinine Clearance: 121 mL/min (by C-G formula based on SCr of 0.97 mg/dL). ? ?Antibiotics Given (last 72 hours)   ? ? Date/Time Action Medication Dose Rate  ? 12/08/21 1450 New Bag/Given  ? ceFEPIme (MAXIPIME) 2 g in sodium chloride 0.9 % 100 mL IVPB 2 g 200 mL/hr  ? 12/08/21 1453 New Bag/Given  ? metroNIDAZOLE (FLAGYL) IVPB 500 mg 500 mg 100 mL/hr  ? ?  ? ? ?Antimicrobials this admission: ? ?Cefepime 12/08/2021  >>  ?vancomycin 12/08/2021  >>  ?Metronidazole 12/08/2021   x 1  ? ?Microbiology results: ?12/08/2021  BCx: sent ?12/08/2021  UCx: sent ?12/08/2021  Resp Panel: sent  ?12/08/2021  MRSA PCR: sent ? ?Thank you for allowing pharmacy to be a part of this patient?s care. ? ?Thomasenia Sales, PharmD ?Clinical Pharmacist ? ? ? ?

## 2021-12-09 ENCOUNTER — Inpatient Hospital Stay (HOSPITAL_BASED_OUTPATIENT_CLINIC_OR_DEPARTMENT_OTHER): Payer: Medicare Other

## 2021-12-09 ENCOUNTER — Other Ambulatory Visit (HOSPITAL_COMMUNITY): Payer: Self-pay | Admitting: *Deleted

## 2021-12-09 ENCOUNTER — Other Ambulatory Visit: Payer: Self-pay

## 2021-12-09 DIAGNOSIS — R6510 Systemic inflammatory response syndrome (SIRS) of non-infectious origin without acute organ dysfunction: Secondary | ICD-10-CM | POA: Diagnosis not present

## 2021-12-09 DIAGNOSIS — R079 Chest pain, unspecified: Secondary | ICD-10-CM | POA: Diagnosis not present

## 2021-12-09 DIAGNOSIS — K529 Noninfective gastroenteritis and colitis, unspecified: Secondary | ICD-10-CM | POA: Diagnosis present

## 2021-12-09 LAB — GASTROINTESTINAL PANEL BY PCR, STOOL (REPLACES STOOL CULTURE)

## 2021-12-09 LAB — CBC
HCT: 40.7 % (ref 39.0–52.0)
Hemoglobin: 13.8 g/dL (ref 13.0–17.0)
MCH: 30.5 pg (ref 26.0–34.0)
MCHC: 33.9 g/dL (ref 30.0–36.0)
MCV: 90 fL (ref 80.0–100.0)
Platelets: 144 10*3/uL — ABNORMAL LOW (ref 150–400)
RBC: 4.52 MIL/uL (ref 4.22–5.81)
RDW: 14.3 % (ref 11.5–15.5)
WBC: 6.9 10*3/uL (ref 4.0–10.5)
nRBC: 0 % (ref 0.0–0.2)

## 2021-12-09 LAB — HEMOGLOBIN A1C
Hgb A1c MFr Bld: 6.8 % — ABNORMAL HIGH (ref 4.8–5.6)
Mean Plasma Glucose: 148.46 mg/dL

## 2021-12-09 LAB — GLUCOSE, CAPILLARY
Glucose-Capillary: 127 mg/dL — ABNORMAL HIGH (ref 70–99)
Glucose-Capillary: 127 mg/dL — ABNORMAL HIGH (ref 70–99)
Glucose-Capillary: 105 mg/dL — ABNORMAL HIGH (ref 70–99)

## 2021-12-09 LAB — BASIC METABOLIC PANEL
Anion gap: 7 (ref 5–15)
BUN: 12 mg/dL (ref 6–20)
CO2: 22 mmol/L (ref 22–32)
Calcium: 7.7 mg/dL — ABNORMAL LOW (ref 8.9–10.3)
Chloride: 107 mmol/L (ref 98–111)
Creatinine, Ser: 0.86 mg/dL (ref 0.61–1.24)
GFR, Estimated: >60 mL/min (ref >60)
Glucose, Bld: 112 mg/dL — ABNORMAL HIGH (ref 70–99)
Potassium: 3.6 mmol/L (ref 3.5–5.1)
Sodium: 136 mmol/L (ref 135–145)

## 2021-12-09 LAB — ECHOCARDIOGRAM COMPLETE
Area-P 1/2: 3.6 cm2
Height: 74 in
S' Lateral: 4.1 cm
Weight: 4419.78 oz

## 2021-12-09 LAB — MRSA NEXT GEN BY PCR, NASAL: MRSA by PCR Next Gen: NOT DETECTED

## 2021-12-09 LAB — PROCALCITONIN: Procalcitonin: 1.12 ng/mL

## 2021-12-09 LAB — HIV ANTIBODY (ROUTINE TESTING W REFLEX): HIV Screen 4th Generation wRfx: NONREACTIVE

## 2021-12-09 MED ORDER — AMLODIPINE BESYLATE 5 MG PO TABS
5.0000 mg | ORAL_TABLET | Freq: Every day | ORAL | Status: DC
  Administered 2021-12-09: 5 mg via ORAL
  Filled 2021-12-09: qty 1

## 2021-12-09 MED ORDER — PERFLUTREN LIPID MICROSPHERE
1.0000 mL | INTRAVENOUS | Status: DC | PRN
  Administered 2021-12-09: 3 mL via INTRAVENOUS

## 2021-12-09 MED ORDER — ORAL CARE MOUTH RINSE: 15.0000 mL | Freq: Two times a day (BID) | OROMUCOSAL | Status: DC

## 2021-12-09 MED ORDER — CHLORHEXIDINE GLUCONATE CLOTH 2 % EX PADS: 6.0000 | MEDICATED_PAD | Freq: Every day | CUTANEOUS | Status: DC

## 2021-12-09 MED ORDER — ONDANSETRON HCL 4 MG PO TABS: 4.0000 mg | ORAL_TABLET | Freq: Four times a day (QID) | ORAL | 0 refills | Status: AC | PRN

## 2021-12-09 MED ORDER — PANTOPRAZOLE SODIUM 40 MG PO TBEC
40.0000 mg | DELAYED_RELEASE_TABLET | Freq: Every day | ORAL | Status: DC
  Administered 2021-12-09: 40 mg via ORAL
  Filled 2021-12-09: qty 1

## 2021-12-09 MED ORDER — BENAZEPRIL HCL 10 MG PO TABS
20.0000 mg | ORAL_TABLET | Freq: Every day | ORAL | Status: DC
Start: 2021-12-09 — End: 2021-12-09
  Administered 2021-12-09: 20 mg via ORAL
  Filled 2021-12-09: qty 2

## 2021-12-09 NOTE — Progress Notes (Signed)
*  PRELIMINARY RESULTS* ?Echocardiogram ?2D Echocardiogram has been performed with Definity. ? ?Brad [name] ?12/09/2021, 2:57 PM ?

## 2021-12-09 NOTE — Discharge Summary (Signed)
?Physician Discharge Summary ?  ?Patient: Brad [name] MRN: [id number] DOB: Mar 04, 1966  ?Admit date:     12/08/2021  ?Discharge date: 12/09/21  ?Discharge Physician: Rodena Goldmann  ? ?PCP: Ludwig Clarks, FNP  ? ?Recommendations at discharge:  ? ?Continue home medications as prior ?Prescribe Zofran as needed for any nausea or vomiting and recommended Imodium over-the-counter as needed for diarrhea ? ?Discharge Diagnoses: ?Principal Problem: ?  SIRS (systemic inflammatory response syndrome) (HCC) ?Active Problems: ?  Chest pain ?  Hypertension ?  Diabetes ([place]) ?  Acute respiratory failure with hypoxia ([place]) ? ?Resolved Problems: ?  * No resolved hospital problems. * ? ?Hospital Course: ?Per HPI: ?Brad [name] is a 56 y.o. male with medical history significant for hypertension, diabetes mellitus, NAFLD. ?Patient presented to the ED with complaints of multiple episodes of loose stools that started at about 3 AM early this morning.  He has also had multiple episodes of vomiting since then.  Also an episode of crampy abdominal pain with diarrhea but that has since resolved.  Today at about 9 AM he stated having persistent mid chest pain, with associated difficulty breathing.  Chest pain is nonradiating.  He describes it like an elephant sitting on his chest.  He denies prior chest pains.  He was lying in bed when chest pain started.  No aggravating or relieving factors.  No family history of cardiac disease.  He has never smoked cigarettes.  He has no personal cardiac history. ?Patient reports a temperature of 102 at home.  No pain with urination, no cough, no neck pain or stiffness, reports a mild headache that is resolved.  ? ?12/09/2021: Patient was admitted with SIRS criteria and chest pain.  He was not noted to have any acute infectious etiologies to declare sepsis.  He was noted to have norovirus gastroenteritis.  He is now tolerating oral intake and has no further nausea or vomiting or diarrhea.  He denies any  further chest pain.  No signs of ACS noted and 2D echocardiogram performed with no significant abnormalities.  He is stable for discharge at this time. ? ?Assessment and Plan: ? ?Principal discharge diagnosis: Acute norovirus gastroenteritis.  Atypical chest pain-likely musculoskeletal. ? ?  ? ? ?Consultants: None ?Procedures performed: 2D echocardiogram ?Disposition: Home ?Diet recommendation:  ?Discharge Diet Orders (From admission, onward)  ? ?  Start     Ordered  ? 12/09/21 0000  Diet - low sodium heart healthy       ? 12/09/21 1530  ? ?  ?  ? ?  ? ?Cardiac and Carb modified diet ?DISCHARGE MEDICATION: ?Allergies as of 12/09/2021   ?No Known Allergies ?  ? ?  ?Medication List  ?  ? ?TAKE these medications   ? ?amLODipine-benazepril 5-20 MG capsule ?Commonly known as: LOTREL ?TAKE 1 CAPSULE BY MOUTH EVERY DAY ?  ?cyclobenzaprine 10 MG tablet ?Commonly known as: FLEXERIL ?Take 1 tablet (10 mg total) by mouth 3 (three) times daily as needed for muscle spasms. ?  ?metFORMIN 1000 MG tablet ?Commonly known as: GLUCOPHAGE ?Take 1,000 mg by mouth 2 (two) times daily with a meal. ?  ?multivitamin with minerals tablet ?Take 1 tablet by mouth daily. ?  ?ondansetron 4 MG tablet ?Commonly known as: ZOFRAN ?Take 1 tablet (4 mg total) by mouth every 6 (six) hours as needed for nausea. ?  ?oxyCODONE 15 mg 12 hr tablet ?Commonly known as: OXYCONTIN ?Take 15 mg by mouth every 6 (six) hours as  needed. ?  ?testosterone cypionate 200 MG/ML injection ?Commonly known as: DEPOTESTOSTERONE CYPIONATE ?Inject 200 mg into the muscle every 30 (thirty) days. ?  ?Vitamin D (Ergocalciferol) 1.25 MG (50000 UNIT) Caps capsule ?Commonly known as: DRISDOL ?Take 50,000 Units by mouth every 7 (seven) days. Sunday ?  ? ?  ? ? Follow-up Information   ? ? Ludwig Clarks, FNP. Schedule an appointment as soon as possible for a visit in 1 week(s).   ?Specialty: Family Medicine ?Contact information: ?[address] ?[place] [address] ?[phone] ? ? ?  ?   ? ?  ?  ? ?  ? ?Discharge Exam: ?Filed Weights  ? 12/08/21 1301 12/09/21 0437  ?Weight: 122.5 kg 125.3 kg  ? ?Examination: ?Physical Exam: ? ?Constitutional: WN/WD, NAD and appears calm and comfortable ?Neck: Appears normal, supple, no cervical masses, normal ROM, no appreciable thyromegaly ?Respiratory: Clear to auscultation bilaterally, no wheezing, rales, rhonchi or crackles. Normal respiratory effort and patient is not tachypenic. No accessory muscle use.  ?Cardiovascular: RRR, no murmurs / rubs / gallops. S1 and S2 auscultated. No extremity edema. 2+ pedal pulses. No carotid bruits.  ?Abdomen: Soft, non-tender, non-distended. No masses palpated. No appreciable hepatosplenomegaly. Bowel sounds positive.  ?GU: Deferred. ?Musculoskeletal: No clubbing / cyanosis of digits/nails. No joint deformity upper and lower extremities. Good ROM, no contractures. Normal strength and muscle tone.  ?Skin: No rashes, lesions, ulcers. No induration; Warm and dry.  ?Neurologic: CN 2-12 grossly intact with no focal deficits.  ?Psychiatric: Normal judgment and insight. Alert and oriented x 3. Normal mood and appropriate affect.  ? ? ?Condition at discharge: stable ? ?The results of significant diagnostics from this hospitalization (including imaging, microbiology, ancillary and laboratory) are listed below for reference.  ? ?Imaging Studies: ?CT Angio Chest PE W/Cm &/Or Wo Cm ? ?Result Date: 12/08/2021 ?CLINICAL DATA:  Chest pain and hypoxia. Nausea, vomiting, and diarrhea. EXAM: CT ANGIOGRAPHY CHEST CT ABDOMEN AND PELVIS WITH CONTRAST TECHNIQUE: Multidetector CT imaging of the chest was performed using the standard protocol during bolus administration of intravenous contrast. Multiplanar CT image reconstructions and MIPs were obtained to evaluate the vascular anatomy. Multidetector CT imaging of the abdomen and pelvis was performed using the standard protocol during bolus administration of intravenous contrast. RADIATION DOSE  REDUCTION: This exam was performed according to the departmental dose-optimization program which includes automated exposure control, adjustment of the mA and/or kV according to patient size and/or use of iterative reconstruction technique. CONTRAST:  130m OMNIPAQUE IOHEXOL 350 MG/ML SOLN COMPARISON:  None. FINDINGS: CTA CHEST FINDINGS Cardiovascular: Satisfactory opacification of the pulmonary arteries to the segmental level. No evidence of pulmonary embolism. Normal heart size. No pericardial effusion. No thoracic aortic aneurysm or dissection. Mediastinum/Nodes: No enlarged mediastinal, hilar, or axillary lymph nodes. Thyroid gland, trachea, and esophagus demonstrate no significant findings. Lungs/Pleura: No focal consolidation, pleural effusion, or pneumothorax. Mild dependent subsegmental atelectasis in both lower lobes in the left upper lobe. Musculoskeletal: Bilateral gynecomastia. No acute or significant osseous findings. Review of the MIP images confirms the above findings. CT ABDOMEN and PELVIS FINDINGS Hepatobiliary: No focal liver abnormality is seen. Status post cholecystectomy. No biliary dilatation. Pancreas: Unremarkable. No pancreatic ductal dilatation or surrounding inflammatory changes. Spleen: Normal in size without focal abnormality. Adrenals/Urinary Tract: Adrenal glands are unremarkable. Kidneys are normal, without renal calculi, focal lesion, or hydronephrosis. Bladder is unremarkable. Stomach/Bowel: Stomach is within normal limits. Appendix appears normal. No evidence of bowel wall thickening, distention, or inflammatory changes. Liquid stool throughout the  colon, consistent with reported diarrheal illness. Vascular/Lymphatic: Aortic atherosclerosis. No enlarged abdominal or pelvic lymph nodes. Reproductive: Prostate is unremarkable. Other: No free fluid or pneumoperitoneum. Musculoskeletal: No acute or significant osseous findings. Prior lower lumbar posterior decompression and interbody  fusion. Review of the MIP images confirms the above findings. IMPRESSION: 1. No acute pulmonary embolism.  No acute intrathoracic process. 2. No acute intra-abdominal process. 3. Aortic Atherosclerosis (I

## 2021-12-09 NOTE — Care Management CC44 (Signed)
Condition Code 44 Documentation Completed ? ?Patient Details  ?Name: Brad [name] ?MRN: [id number] ?Date of Birth: July 19, 1966 ? ? ?Condition Code 44 given:  Yes ?Patient signature on Condition Code 44 notice:  Yes ?Documentation of 2 MD's agreement:  Yes ?Code 44 added to claim:  Yes ? ? ? ?Shade Flood, LCSW ?12/09/2021, 4:04 PM ? ?

## 2021-12-09 NOTE — Care Management Obs Status (Signed)
MEDICARE OBSERVATION STATUS NOTIFICATION ? ? ?Patient Details  ?Name: Brad [name] ?MRN: [id number] ?Date of Birth: 05-Sep-1966 ? ? ?Medicare Observation Status Notification Given:  Yes ? ? ? ?Shade Flood, LCSW ?12/09/2021, 4:04 PM ?

## 2021-12-09 NOTE — Hospital Course (Signed)
Per HPI: ?Brad [name] is a 56 y.o. male with medical history significant for hypertension, diabetes mellitus, NAFLD. ?Patient presented to the ED with complaints of multiple episodes of loose stools that started at about 3 AM early this morning.  He has also had multiple episodes of vomiting since then.  Also an episode of crampy abdominal pain with diarrhea but that has since resolved.  Today at about 9 AM he stated having persistent mid chest pain, with associated difficulty breathing.  Chest pain is nonradiating.  He describes it like an elephant sitting on his chest.  He denies prior chest pains.  He was lying in bed when chest pain started.  No aggravating or relieving factors.  No family history of cardiac disease.  He has never smoked cigarettes.  He has no personal cardiac history. ?Patient reports a temperature of 102 at home.  No pain with urination, no cough, no neck pain or stiffness, reports a mild headache that is resolved.  ? ?12/09/2021: Patient was admitted with SIRS criteria and chest pain.  He was not noted to have any acute infectious etiologies to declare sepsis.  He was noted to have norovirus gastroenteritis.  He is now tolerating oral intake and has no further nausea or vomiting or diarrhea.  He denies any further chest pain.  No signs of ACS noted and 2D echocardiogram performed with no significant abnormalities.  He is stable for discharge at this time. ?

## 2021-12-09 NOTE — Progress Notes (Signed)
Date and time results received: 12/09/21 1346 ? ?Test: GI  ?Critical Value: Norovirus  ?Name of Provider Notified: Rodena Goldmann. DO ? ?Orders Received? Or Actions Taken?: See New orders  ?

## 2021-12-09 NOTE — Progress Notes (Signed)
?  Transition of Care (TOC) Screening Note ? ? ?Patient Details  ?Name: Brad [name] ?Date of Birth: [birth year] ? ? ?Transition of Care (TOC) CM/SW Contact:    ?Iona Beard, LCSWA ?Phone Number: ?12/09/2021, 12:11 PM ? ? ? ?Transition of Care Department [facility]) has reviewed patient and no TOC needs have been identified at this time. We will continue to monitor patient advancement through interdisciplinary progression rounds. If new patient transition needs arise, please place a TOC consult. ?  ?

## 2021-12-10 LAB — URINE CULTURE: Culture: NO GROWTH

## 2021-12-13 LAB — CULTURE, BLOOD (ROUTINE X 2)
Culture: NO GROWTH
Culture: NO GROWTH
Special Requests: ADEQUATE

## 2021-12-19 MED FILL — Perflutren Lipid Microsphere IV Susp 1.1 MG/ML: INTRAVENOUS | Qty: 10 | Status: AC

## 2023-09-25 ENCOUNTER — Other Ambulatory Visit (HOSPITAL_COMMUNITY): Payer: Self-pay | Admitting: Neurosurgery

## 2023-09-25 DIAGNOSIS — M5412 Radiculopathy, cervical region: Secondary | ICD-10-CM

## 2023-09-25 DIAGNOSIS — M5440 Lumbago with sciatica, unspecified side: Secondary | ICD-10-CM

## 2023-10-03 ENCOUNTER — Ambulatory Visit (HOSPITAL_COMMUNITY)
Admission: RE | Admit: 2023-10-03 | Discharge: 2023-10-03 | Disposition: A | Discharge status: Home / Self Care | Payer: Medicare Other | Source: Ambulatory Visit | Attending: Neurosurgery | Admitting: Neurosurgery

## 2023-10-03 DIAGNOSIS — M5440 Lumbago with sciatica, unspecified side: Secondary | ICD-10-CM | POA: Insufficient documentation

## 2023-10-03 DIAGNOSIS — M5412 Radiculopathy, cervical region: Secondary | ICD-10-CM | POA: Diagnosis present

## 2023-10-17 ENCOUNTER — Ambulatory Visit: Payer: Medicare Other | Admitting: Orthopaedic Surgery

## 2024-07-21 ENCOUNTER — Encounter: Payer: Self-pay | Admitting: Radiology

## 2024-07-22 ENCOUNTER — Other Ambulatory Visit (HOSPITAL_COMMUNITY): Payer: Self-pay | Admitting: Family Medicine

## 2024-07-22 ENCOUNTER — Ambulatory Visit (HOSPITAL_COMMUNITY)
Admission: RE | Admit: 2024-07-22 | Discharge: 2024-07-22 | Disposition: A | Discharge status: Home / Self Care | Source: Ambulatory Visit | Attending: Family Medicine | Admitting: Family Medicine

## 2024-07-22 DIAGNOSIS — M25551 Pain in right hip: Secondary | ICD-10-CM | POA: Insufficient documentation

## 2024-07-22 DIAGNOSIS — M25512 Pain in left shoulder: Secondary | ICD-10-CM | POA: Diagnosis present

## 2024-07-22 DIAGNOSIS — M25552 Pain in left hip: Secondary | ICD-10-CM | POA: Insufficient documentation

## 2024-07-22 DIAGNOSIS — M25511 Pain in right shoulder: Secondary | ICD-10-CM | POA: Insufficient documentation
